# Patient Record
Sex: Male | Born: 1985 | Race: Black or African American | Hispanic: No | Marital: Single | Smoking: Never smoker
Health system: Southern US, Community
[De-identification: ages and names within clinical notes are randomized; demographics above are authoritative.]

## PROBLEM LIST (undated history)

## (undated) DIAGNOSIS — I429 Cardiomyopathy, unspecified: Secondary | ICD-10-CM

## (undated) DIAGNOSIS — T8859XA Other complications of anesthesia, initial encounter: Secondary | ICD-10-CM

## (undated) DIAGNOSIS — I517 Cardiomegaly: Secondary | ICD-10-CM

## (undated) DIAGNOSIS — I2699 Other pulmonary embolism without acute cor pulmonale: Secondary | ICD-10-CM

## (undated) DIAGNOSIS — K429 Umbilical hernia without obstruction or gangrene: Secondary | ICD-10-CM

## (undated) DIAGNOSIS — Z9989 Dependence on other enabling machines and devices: Secondary | ICD-10-CM

## (undated) DIAGNOSIS — G4733 Obstructive sleep apnea (adult) (pediatric): Secondary | ICD-10-CM

## (undated) DIAGNOSIS — T4145XA Adverse effect of unspecified anesthetic, initial encounter: Secondary | ICD-10-CM

## (undated) HISTORY — DX: Cardiomyopathy, unspecified: I42.9

## (undated) HISTORY — DX: Cardiomegaly: I51.7

## (undated) HISTORY — PX: WISDOM TOOTH EXTRACTION: SHX21

## (undated) HISTORY — DX: Obstructive sleep apnea (adult) (pediatric): Z99.89

## (undated) HISTORY — DX: Obstructive sleep apnea (adult) (pediatric): G47.33

---
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
## 2010-05-15 ENCOUNTER — Emergency Department (HOSPITAL_COMMUNITY): Admission: EM | Admit: 2010-05-15 | Discharge: 2010-05-16 | Payer: Self-pay | Admitting: Emergency Medicine

## 2010-05-17 ENCOUNTER — Emergency Department (HOSPITAL_COMMUNITY): Admission: EM | Admit: 2010-05-17 | Discharge: 2010-05-17 | Payer: Self-pay | Admitting: Emergency Medicine

## 2011-02-12 LAB — CBC
HCT: 40.7 % (ref 39.0–52.0)
Hemoglobin: 13.2 g/dL (ref 13.0–17.0)
MCHC: 32.1 g/dL (ref 30.0–36.0)
MCV: 82.7 fL (ref 78.0–100.0)
Platelets: 167 10*3/uL (ref 150–400)
RDW: 14 % (ref 11.5–15.5)
WBC: 15 10*3/uL — ABNORMAL HIGH (ref 4.0–10.5)

## 2011-02-12 LAB — DIFFERENTIAL
Basophils Absolute: 0 10*3/uL (ref 0.0–0.1)
Eosinophils Absolute: 0 10*3/uL (ref 0.0–0.7)
Eosinophils Relative: 0 % (ref 0–5)
Eosinophils Relative: 0 % (ref 0–5)
Lymphs Abs: 1.4 10*3/uL (ref 0.7–4.0)
Monocytes Absolute: 1.6 10*3/uL — ABNORMAL HIGH (ref 0.1–1.0)
Monocytes Relative: 11 % (ref 3–12)
Monocytes Relative: 12 % (ref 3–12)
Neutro Abs: 11.4 10*3/uL — ABNORMAL HIGH (ref 1.7–7.7)
Neutrophils Relative %: 76 % (ref 43–77)
Neutrophils Relative %: 80 % — ABNORMAL HIGH (ref 43–77)

## 2011-02-12 LAB — BASIC METABOLIC PANEL
BUN: 11 mg/dL (ref 6–23)
CO2: 25 mEq/L (ref 19–32)
Chloride: 104 mEq/L (ref 96–112)
Creatinine, Ser: 1.22 mg/dL (ref 0.4–1.5)
GFR calc non Af Amer: >60 mL/min (ref >60)
Glucose, Bld: 96 mg/dL (ref 70–99)
Potassium: 3.7 mEq/L (ref 3.5–5.1)
Potassium: 3.7 mEq/L (ref 3.5–5.1)

## 2011-02-12 LAB — RAPID STREP SCREEN (MED CTR MEBANE ONLY): Streptococcus, Group A Screen (Direct): NEGATIVE

## 2011-07-20 ENCOUNTER — Emergency Department (HOSPITAL_COMMUNITY): Payer: Self-pay

## 2011-07-20 ENCOUNTER — Emergency Department (HOSPITAL_COMMUNITY)
Admission: EM | Admit: 2011-07-20 | Discharge: 2011-07-20 | Disposition: A | Discharge status: Home / Self Care | Payer: Self-pay | Attending: Emergency Medicine | Admitting: Emergency Medicine

## 2011-07-20 DIAGNOSIS — R0602 Shortness of breath: Secondary | ICD-10-CM | POA: Insufficient documentation

## 2011-07-20 DIAGNOSIS — J40 Bronchitis, not specified as acute or chronic: Secondary | ICD-10-CM | POA: Insufficient documentation

## 2011-07-20 DIAGNOSIS — R05 Cough: Secondary | ICD-10-CM | POA: Insufficient documentation

## 2011-07-20 DIAGNOSIS — R059 Cough, unspecified: Secondary | ICD-10-CM | POA: Insufficient documentation

## 2012-08-18 ENCOUNTER — Emergency Department (HOSPITAL_BASED_OUTPATIENT_CLINIC_OR_DEPARTMENT_OTHER)
Admission: EM | Admit: 2012-08-18 | Discharge: 2012-08-19 | Disposition: A | Discharge status: Home / Self Care | Payer: BC Managed Care – PPO | Attending: Emergency Medicine | Admitting: Emergency Medicine

## 2012-08-18 ENCOUNTER — Encounter (HOSPITAL_BASED_OUTPATIENT_CLINIC_OR_DEPARTMENT_OTHER): Payer: Self-pay | Admitting: *Deleted

## 2012-08-18 DIAGNOSIS — S161XXA Strain of muscle, fascia and tendon at neck level, initial encounter: Secondary | ICD-10-CM

## 2012-08-18 DIAGNOSIS — S139XXA Sprain of joints and ligaments of unspecified parts of neck, initial encounter: Secondary | ICD-10-CM | POA: Insufficient documentation

## 2012-08-18 DIAGNOSIS — X58XXXA Exposure to other specified factors, initial encounter: Secondary | ICD-10-CM | POA: Insufficient documentation

## 2012-08-18 MED ORDER — CYCLOBENZAPRINE HCL 10 MG PO TABS
10.0000 mg | ORAL_TABLET | Freq: Once | ORAL | Status: AC
  Administered 2012-08-18: 10 mg via ORAL

## 2012-08-18 MED ORDER — ORPHENADRINE CITRATE ER 100 MG PO TB12: 100.0000 mg | ORAL_TABLET | Freq: Two times a day (BID) | ORAL | Status: DC

## 2012-08-18 MED ORDER — CYCLOBENZAPRINE HCL 10 MG PO TABS
5.0000 mg | ORAL_TABLET | Freq: Once | ORAL | Status: DC
  Filled 2012-08-18: qty 1

## 2012-08-18 MED ORDER — NAPROXEN 500 MG PO TABS: 500.0000 mg | ORAL_TABLET | Freq: Two times a day (BID) | ORAL | Status: DC

## 2012-08-18 MED ORDER — IBUPROFEN 800 MG PO TABS
800.0000 mg | ORAL_TABLET | Freq: Once | ORAL | Status: AC
  Administered 2012-08-18: 800 mg via ORAL
  Filled 2012-08-18: qty 1

## 2012-08-18 NOTE — ED Provider Notes (Signed)
History   This chart was scribed for Dione Booze, MD by Sofie Rower. The patient was seen in room MH11/MH11 and the patient's care was started at 11:01PM.     CSN: [id number]  Arrival date & time 08/18/12  2152   First MD Initiated Contact with Patient 08/18/12 2301      Chief Complaint  Patient presents with  . Neck Pain    (Consider location/radiation/quality/duration/timing/severity/associated sxs/prior treatment) Patient is a 26 y.o. male presenting with neck pain. The history is provided by the patient. No language interpreter was used.  Neck Pain  This is a new problem. The current episode started more than 1 week ago. The problem occurs constantly. The problem has been gradually worsening. The pain is associated with nothing. There has been no fever. The pain is present in the generalized neck. The quality of the pain is described as stabbing. The pain does not radiate. The pain is at a severity of 7/10. The pain is moderate. The symptoms are aggravated by position and twisting. The pain is the same all the time. Associated symptoms include headaches. Pertinent negatives include no numbness, no tingling and no weakness. He has tried NSAIDs for the symptoms. The treatment provided moderate relief.    Joe Deleon is a 26 y.o. male who presents to the Emergency Department complaining of  gradual, progressively worsening, neck pain located at the neck and interscapular area onset one week ago with associated symptoms of back pain and headache. The pt reports the neck pain he is experiencing is a stabbing sensation. In addition, the pt rates his neck pain at 7/10 at present, and 8/10 at worst. Modifying factors include certain movements and positions of the head and neck which intensify the neck pain and taking motrin (200 mg) which provides moderate relief of neck pain. The pt denies weakness, numbness, and tingling associated with the neck pain.    The pt does not smoke or drink  alcohol.   Pt does not have a PCP at present.    History reviewed. No pertinent past medical history.  History reviewed. No pertinent past surgical history.  No family history on file.  History  Substance Use Topics  . Smoking status: Never Smoker   . Smokeless tobacco: Not on file  . Alcohol Use: No      Review of Systems  HENT: Positive for neck pain.   Neurological: Positive for headaches. Negative for tingling, weakness and numbness.  All other systems reviewed and are negative.    Allergies  Review of patient's allergies indicates no known allergies.  Home Medications  No current outpatient prescriptions on file.  BP 132/70  Pulse 85  Temp 97.7 F (36.5 C)  Resp 18  Ht 6\' 2"  (1.88 m)  Wt 370 lb (167.831 kg)  BMI 47.51 kg/m2  SpO2 99%  Physical Exam  Nursing note and vitals reviewed. Constitutional: He is oriented to person, place, and time. He appears well-developed and well-nourished.       Obese.   HENT:  Head: Atraumatic.  Right Ear: External ear normal.  Left Ear: External ear normal.  Nose: Nose normal.  Eyes: Conjunctivae normal and EOM are normal.  Neck: Normal range of motion. Neck supple.  Cardiovascular: Normal rate, regular rhythm and normal heart sounds.   Pulmonary/Chest: Effort normal and breath sounds normal.  Abdominal: Soft. Bowel sounds are normal.  Musculoskeletal: Normal range of motion.       Moderate paracervical and paraspinal muscle spasm detected.  Neurological: He is alert and oriented to person, place, and time.  Skin: Skin is warm and dry.  Psychiatric: He has a normal mood and affect. His behavior is normal.    ED Course  Procedures (including critical care time)  DIAGNOSTIC STUDIES: Oxygen Saturation is 99% on room air, normal by my interpretation.    COORDINATION OF CARE:    11:06PM- Treatment plan concerning anti-inflammatories and muscle relaxer's, application of ice and heat discussed. Pt agrees with  treatment.    1. Cervical strain       MDM  Neck pain which seems to be clearly musculoskeletal. Curiously, it is better with when he is moving. However, he does have muscle spasm on exam. You'll be treated symptomatically with a prescription given for naproxen and Norflex. He is concerned about possibly having injured it at work, so he is given a work release to not lift anything greater than 25 pounds for the next 5 days. He is referred to Dr. Pearletha Forge of sports medicine for further evaluation.      I personally performed the services described in this documentation, which was scribed in my presence. The recorded information has been reviewed and considered.      Dione Booze, MD 08/19/12 [phone]

## 2012-08-18 NOTE — ED Notes (Signed)
Pt presents to ED today with neck stiffness that radiates into upper back.  Pt reports no injury or trauma and no obvious deformities noted.

## 2012-08-18 NOTE — ED Notes (Signed)
MD at bedside. 

## 2012-08-18 NOTE — ED Notes (Signed)
Pt presents to ED today with neck and back pain with no reported injury.  Pt states pain has been ongoing for 1 week and has tried taking Motrin with no relief.

## 2012-08-19 NOTE — ED Notes (Signed)
Extensive discharge education and planning discussed with pt and family.  Pt and family extremely upset with EDP as they requested to speak with him and he never went back into room.  Discussed with pt, need to rest muscles to allow for healing.  Pt was only given "restricted work note".  Education done on alternatives for work and options for discussing medical information with co workers and boss.

## 2012-08-21 ENCOUNTER — Emergency Department (INDEPENDENT_AMBULATORY_CARE_PROVIDER_SITE_OTHER): Payer: BC Managed Care – PPO

## 2012-08-21 ENCOUNTER — Encounter (HOSPITAL_COMMUNITY): Payer: Self-pay

## 2012-08-21 ENCOUNTER — Emergency Department (INDEPENDENT_AMBULATORY_CARE_PROVIDER_SITE_OTHER)
Admission: EM | Admit: 2012-08-21 | Discharge: 2012-08-21 | Disposition: A | Discharge status: Home / Self Care | Payer: BC Managed Care – PPO | Source: Home / Self Care | Attending: Emergency Medicine | Admitting: Emergency Medicine

## 2012-08-21 DIAGNOSIS — M542 Cervicalgia: Secondary | ICD-10-CM

## 2012-08-21 MED ORDER — HYDROCODONE-IBUPROFEN 7.5-200 MG PO TABS: 1.0000 | ORAL_TABLET | Freq: Three times a day (TID) | ORAL | Status: AC | PRN

## 2012-08-21 NOTE — ED Notes (Addendum)
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 States he reported this to his immediate supervisor on the day of the occurrence. Denies numbness , tingling, pain in his arms

## 2012-08-21 NOTE — ED Provider Notes (Signed)
History     CSN: [id number]  Arrival date & time 08/21/12  1826   First MD Initiated Contact with Patient 08/21/12 1827      Chief Complaint  Patient presents with  . Neck Pain    (Consider location/radiation/quality/duration/timing/severity/associated sxs/prior treatment) HPI Comments: Patient presents urgent care accompanied by his mother as he describes he continues to experience pain on his neck area towards the left upper portion of his shoulder. The pain continues to be constant. Patient was seen in the emergency department 2 days ago was diagnosed with muscular spasms. Patient denies any numbness, tingling sensations of any of his upper or lower extremities. 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 Patient denies any other symptoms such as fevers, generalized malaise, myalgias and arthralgias, headaches nausea vomiting. He's been taking some Motrin with some relief but he feels the pain is somewhat worse than when he was seen a couple days ago. There are certain movements especially turning his head towards his left side or bending his head forward this seemed to exacerbate the pain further.  The history is provided by the patient.    History reviewed. No pertinent past medical history.  History reviewed. No pertinent past surgical history.  History reviewed. No pertinent family history.  History  Substance Use Topics  . Smoking status: Never Smoker   . Smokeless tobacco: Not on file  . Alcohol Use: No      Review of Systems  Constitutional: Positive for activity change. Negative for fever, chills, diaphoresis and fatigue.  HENT: Positive for neck pain. Negative for neck stiffness.   Eyes: Negative for visual disturbance.  Musculoskeletal: Positive for back pain. Negative for myalgias, joint swelling and  arthralgias.  Neurological: Negative for dizziness, weakness, numbness and headaches.  Psychiatric/Behavioral: Negative for behavioral problems and confusion.    Allergies  Review of patient's allergies indicates no known allergies.  Home Medications   Current Outpatient Rx  Name Route Sig Dispense Refill  . HYDROCODONE-IBUPROFEN 7.5-200 MG PO TABS Oral Take 1 tablet by mouth every 8 (eight) hours as needed for pain. 15 tablet 0  . ORPHENADRINE CITRATE ER 100 MG PO TB12 Oral Take 1 tablet (100 mg total) by mouth 2 (two) times daily. 30 tablet 0    BP 118/85  Pulse 90  Temp 98.4 F (36.9 C) (Oral)  Resp 16  SpO2 96%  Physical Exam  Nursing note and vitals reviewed. Constitutional: He appears well-developed and well-nourished.  Neck: Trachea normal and normal range of motion. Spinous process tenderness and muscular tenderness present. No rigidity. No tracheal deviation, no edema, no erythema and normal range of motion present. No Brudzinski's sign and no Kernig's sign noted. No mass present.  Musculoskeletal: He exhibits tenderness.       Arms: Neurological: He is alert. No cranial nerve deficit. He exhibits normal muscle tone. Coordination normal.  Skin: Skin is warm. No ecchymosis noted. No erythema.    ED Course  Procedures (including critical care time)  Labs Reviewed - No data to display Dg Cervical Spine Complete  08/21/2012  *RADIOLOGY REPORT*  Clinical Data: Neck pain, lifting injury  CERVICAL SPINE - COMPLETE 4+ VIEW  Comparison: None.  Findings: Cervical spine visualize to C6-7 on the lateral view and the 71 on the lateral swimmer's view.  No  evidence of fracture or dislocation.  Vertebral body heights and intervertebral disc spaces are maintained.  The dens appears intact.  Lateral masses of C1 are symmetric.  No prevertebral soft tissue swelling.  The bilateral neural foramina are patent.  Visualized lung apices are clear.  IMPRESSION: Normal cervical spine  radiographs.   Original Report Authenticated By: Charline Bills, M.D.      1. Neck pain, musculoskeletal       MDM   Patient presents to urgent care with ongoing posterior cervical pain with some radiation to his left upper trapezium region. Exam was somewhat consistent with musculoskeletal strain/sprain. X-rays were negative for subluxations, fractures or other abnormalities. Patient has been prescribed a Vicoprofen course of 5 days every 8 hours. A work note was admitted to stay off his work for the next 48 hours to return with some restrictions for 3 days and otherwise to followup with occupational health workers comp if pain was to persist. We discuss symptoms of should warrant further evaluation in the emergency department both patient and mother understands, agrees with treatment plan followup care as necessary.       Jimmie Molly, MD 08/21/12 2105

## 2012-08-22 NOTE — ED Notes (Signed)
Pt called stating that he was seen here last night and given a work note to be out today.  States that he was told that if didn't think he could go back to work that he could call back and get another note.  I told the pt that we don't typically do that but that I would pull his chart and speak with the MD that seen him.  Pt starts telling someone in the background that " she says they dont' typically do that, well oh yes she is going to do that".  I said sir, I hear everything your saying and I said typically we don't but sometimes they do.  I pulled his chart, spoke with Dr. Ladon Applebaum and he agreed to give pt ONLY one more day off and that he had to follow up with [facility] or Ortho.  I told pt this and he said he has an appt Monday, I told him that Dr. Ladon Applebaum remembered his case and agreed to one more day but that we would NOT give him any more days off for this injury.  Pt agreed.  Note placed at front desk.

## 2012-09-12 ENCOUNTER — Other Ambulatory Visit: Payer: Self-pay | Admitting: Orthopaedic Surgery

## 2012-09-12 DIAGNOSIS — M542 Cervicalgia: Secondary | ICD-10-CM

## 2012-09-15 ENCOUNTER — Ambulatory Visit
Admission: RE | Admit: 2012-09-15 | Discharge: 2012-09-15 | Disposition: A | Discharge status: Home / Self Care | Payer: BC Managed Care – PPO | Source: Ambulatory Visit | Attending: Orthopaedic Surgery | Admitting: Orthopaedic Surgery

## 2012-09-15 DIAGNOSIS — M542 Cervicalgia: Secondary | ICD-10-CM

## 2013-12-07 ENCOUNTER — Emergency Department (HOSPITAL_BASED_OUTPATIENT_CLINIC_OR_DEPARTMENT_OTHER)
Admission: EM | Admit: 2013-12-07 | Discharge: 2013-12-07 | Disposition: A | Discharge status: Home / Self Care | Payer: BC Managed Care – PPO | Attending: Emergency Medicine | Admitting: Emergency Medicine

## 2013-12-07 ENCOUNTER — Emergency Department (HOSPITAL_BASED_OUTPATIENT_CLINIC_OR_DEPARTMENT_OTHER): Payer: BC Managed Care – PPO

## 2013-12-07 ENCOUNTER — Encounter (HOSPITAL_BASED_OUTPATIENT_CLINIC_OR_DEPARTMENT_OTHER): Payer: Self-pay | Admitting: Emergency Medicine

## 2013-12-07 DIAGNOSIS — IMO0001 Reserved for inherently not codable concepts without codable children: Secondary | ICD-10-CM | POA: Insufficient documentation

## 2013-12-07 DIAGNOSIS — R059 Cough, unspecified: Secondary | ICD-10-CM | POA: Insufficient documentation

## 2013-12-07 DIAGNOSIS — J3489 Other specified disorders of nose and nasal sinuses: Secondary | ICD-10-CM | POA: Insufficient documentation

## 2013-12-07 DIAGNOSIS — R630 Anorexia: Secondary | ICD-10-CM | POA: Insufficient documentation

## 2013-12-07 DIAGNOSIS — R05 Cough: Secondary | ICD-10-CM | POA: Insufficient documentation

## 2013-12-07 DIAGNOSIS — R5381 Other malaise: Secondary | ICD-10-CM | POA: Insufficient documentation

## 2013-12-07 DIAGNOSIS — R6889 Other general symptoms and signs: Secondary | ICD-10-CM

## 2013-12-07 DIAGNOSIS — R5383 Other fatigue: Secondary | ICD-10-CM | POA: Insufficient documentation

## 2013-12-07 DIAGNOSIS — R110 Nausea: Secondary | ICD-10-CM | POA: Insufficient documentation

## 2013-12-07 MED ORDER — PHENYLEPH-PROMETHAZINE-COD 5-6.25-10 MG/5ML PO SYRP: 5.0000 mL | ORAL_SOLUTION | ORAL | Status: DC

## 2013-12-07 NOTE — ED Provider Notes (Signed)
CSN: [id number]     Arrival date & time 12/07/13  1645 History  This chart was scribed for non-physician practitioner Kerrie BuffaloHope Abie Cheek, NP working with Hurman HornJohn M Bednar, MD by Dorothey Basemania Sutton, ED Scribe. This patient was seen in room MH03/MH03 and the patient's care was started at 6:46 PM.    Chief Complaint  Patient presents with  . Chills   The history is provided by the patient. No language interpreter was used.   HPI Comments: Joe Deleon is a 28 y.o. male who presents to the Emergency Department complaining of an intermittent cough with 1 episode of brown sputum this morning onset 3 days ago that has been progressively worsening. Patient reports associated subjective fever (patient is afebrile at 99.6 in the ED), chills, fatigue, congestion, rhinorrhea, diffuse headache, and diffuse myalgias. He reports some associated mild nausea that is alleviated with sipping soda. He states that he has been staying well-hydrated. He denies any sick contacts, but states that he is exposed to children at his work. He denies sore throat, ear pain, shortness of breath, urinary frequency, dysuria, back pain, leg swelling, abdominal pain, emesis. Patient reports a history of pneumonia in 2012. Patient has no other pertinent medical history. Patient is a non-smoker.  History reviewed. No pertinent past medical history. Past Surgical History  Procedure Laterality Date  . Wisdom tooth extraction     No family history on file. History  Substance Use Topics  . Smoking status: Never Smoker   . Smokeless tobacco: Never Used  . Alcohol Use: No    Review of Systems  Constitutional: Positive for fever (subjective), chills and fatigue.  HENT: Positive for congestion and rhinorrhea. Negative for ear pain and sore throat.   Respiratory: Positive for cough. Negative for shortness of breath.   Gastrointestinal: Positive for nausea. Negative for vomiting and abdominal pain.  Genitourinary: Negative for dysuria and frequency.   Musculoskeletal: Positive for myalgias. Negative for back pain.  Neurological: Positive for headaches.    Allergies  Review of patient's allergies indicates no known allergies.  Home Medications   Current Outpatient Rx  Name  Route  Sig  Dispense  Refill  . orphenadrine (NORFLEX) 100 MG tablet   Oral   Take 1 tablet (100 mg total) by mouth 2 (two) times daily.   30 tablet   0    Triage Vitals: BP 131/85  Pulse 86  Temp(Src) 99.6 F (37.6 C) (Oral)  Resp 20  Ht 6\' 2"  (1.88 m)  Wt 363 lb (164.656 kg)  BMI 46.59 kg/m2  SpO2 95%  Physical Exam  Nursing note and vitals reviewed. Constitutional: He is oriented to person, place, and time. He appears well-developed and well-nourished. No distress.  HENT:  Head: Normocephalic and atraumatic.  Right Ear: Hearing, tympanic membrane, external ear and ear canal normal.  Left Ear: Hearing, tympanic membrane, external ear and ear canal normal.  Mouth/Throat: No oropharyngeal exudate.  Pharynx is mildly erythematous. Uvula is midline.   Eyes: Conjunctivae are normal.  Neck: Normal range of motion. Neck supple.  Cardiovascular: Normal rate, regular rhythm and normal heart sounds.   Pulmonary/Chest: Effort normal and breath sounds normal. No respiratory distress. He has no wheezes. He has no rales.  Abdominal: Soft. Bowel sounds are normal. He exhibits no distension. There is no tenderness. There is no rebound and no guarding.  No CVA tenderness.   Musculoskeletal: Normal range of motion.  Lymphadenopathy:    He has no cervical adenopathy.  Neurological: He is  alert and oriented to person, place, and time.  Skin: Skin is warm and dry.  Psychiatric: He has a normal mood and affect. His behavior is normal.    ED Course  Procedures (including critical care time)  DIAGNOSTIC STUDIES: Oxygen Saturation is 95% on room air, adequate by my interpretation.    COORDINATION OF CARE: 6:52 PM- Will order a chest x-ray to rule out  pneumonia. Will discharge patient with Phenyleph-Promethazine to manage symptoms. Discussed treatment plan with patient at bedside and patient verbalized agreement.    Imaging Review Dg Chest 2 View  12/07/2013   CLINICAL DATA:  Three day history of cough, fever, chills, fatigue, chest congestion, rhinorrhea, headache, and myalgias.  EXAM: CHEST  2 VIEW  COMPARISON:  07/20/2011, 05/16/2010.  FINDINGS: Cardiac silhouette mildly to moderately enlarged but stable. Hilar and mediastinal contours otherwise unremarkable. Lungs clear. Bronchovascular markings normal. Pulmonary vascularity normal. No visible pleural effusions. No pneumothorax. No significant interval change.  IMPRESSION: Stable cardiomegaly.  No acute cardiopulmonary disease.   Electronically Signed   By: Hulan Saas M.D.   On: 12/07/2013 19:12     MDM  SUBJECTIVE:  Joe Deleon is a 28 y.o. male who present complaining of flu-like symptoms: fevers, chills, myalgias, congestion, sore throat and cough for 2 days. Denies dyspnea or wheezing.  OBJECTIVE: Appears moderately ill but not toxic; temperature as noted in vitals. Ears normal. Throat and pharynx normal.  Neck supple. No adenopathy in the neck. Sinuses non tender. The chest is clear.  ASSESSMENT: Influenza  PLAN: Symptomatic therapy suggested: rest, increase fluids, gargle prn for sore throat, use mist of vaporizer prn and call prn if symptoms persist or worsen. Call or return to clinic prn if these symptoms worsen or fail to improve as anticipated. Patient agrees with plan of care.   Medication List    TAKE these medications       Phenyleph-Promethazine-Cod 5-6.25-10 MG/5ML Syrp  Take 5 mLs by mouth every 4 (four) hours.      ASK your doctor about these medications       orphenadrine 100 MG tablet  Commonly known as:  NORFLEX  Take 1 tablet (100 mg total) by mouth 2 (two) times daily.         I personally performed the services described in this  documentation, which was scribed in my presence. The recorded information has been reviewed and is accurate.     [facility] Orlene Och, [place] 12/08/13 1747

## 2013-12-07 NOTE — ED Notes (Signed)
Cough, cold chills, weak, nausea, body aches, since Friday- states getting progressively worse- taking tylenol cough and other otc meds

## 2013-12-08 NOTE — ED Provider Notes (Signed)
Medical screening examination/treatment/procedure(s) were performed by non-physician practitioner and as supervising physician I was immediately available for consultation/collaboration.  EKG Interpretation   None        Imaad Reuss M Willard Farquharson, MD 12/08/13 2121 

## 2014-05-19 ENCOUNTER — Encounter (HOSPITAL_COMMUNITY): Payer: Self-pay | Admitting: Emergency Medicine

## 2014-05-19 ENCOUNTER — Emergency Department (INDEPENDENT_AMBULATORY_CARE_PROVIDER_SITE_OTHER)
Admission: EM | Admit: 2014-05-19 | Discharge: 2014-05-19 | Disposition: A | Discharge status: Home / Self Care | Payer: BC Managed Care – PPO | Source: Home / Self Care | Attending: Family Medicine | Admitting: Family Medicine

## 2014-05-19 DIAGNOSIS — K429 Umbilical hernia without obstruction or gangrene: Secondary | ICD-10-CM

## 2014-05-19 NOTE — ED Notes (Signed)
C/o mass on abd area for a week now Mass is painful when touched No discharge

## 2014-05-19 NOTE — Discharge Instructions (Signed)
Call the surgeon if this start to bother you more. It is not dangerous to you.   Take Care,   Dr. Clinton SawyerWilliamson   Hernia A hernia occurs when an internal organ pushes out through a weak spot in the abdominal wall. Hernias most commonly occur in the groin and around the navel. Hernias often can be pushed back into place (reduced). Most hernias tend to get worse over time. Some abdominal hernias can get stuck in the opening (irreducible or incarcerated hernia) and cannot be reduced. An irreducible abdominal hernia which is tightly squeezed into the opening is at risk for impaired blood supply (strangulated hernia). A strangulated hernia is a medical emergency. Because of the risk for an irreducible or strangulated hernia, surgery may be recommended to repair a hernia. CAUSES   Heavy lifting.  Prolonged coughing.  Straining to have a bowel movement.  A cut (incision) made during an abdominal surgery. HOME CARE INSTRUCTIONS   Bed rest is not required. You may continue your normal activities.  Avoid lifting more than 10 pounds (4.5 kg) or straining.  Cough gently. If you are a smoker it is best to stop. Even the best hernia repair can break down with the continual strain of coughing. Even if you do not have your hernia repaired, a cough will continue to aggravate the problem.  Do not wear anything tight over your hernia. Do not try to keep it in with an outside bandage or truss. These can damage abdominal contents if they are trapped within the hernia sac.  Eat a normal diet.  Avoid constipation. Straining over long periods of time will increase hernia size and encourage breakdown of repairs. If you cannot do this with diet alone, stool softeners may be used. SEEK IMMEDIATE MEDICAL CARE IF:   You have a fever.  You develop increasing abdominal pain.  You feel nauseous or vomit.  Your hernia is stuck outside the abdomen, looks discolored, feels hard, or is tender.  You have any  changes in your bowel habits or in the hernia that are unusual for you.  You have increased pain or swelling around the hernia.  You cannot push the hernia back in place by applying gentle pressure while lying down. MAKE SURE YOU:   Understand these instructions.  Will watch your condition.  Will get help right away if you are not doing well or get worse. Document Released: 11/13/2005 Document Revised: 02/05/2012 Document Reviewed: 07/02/2008 [facility] Patient Information 2015 [place] [place]. This information is not intended to replace advice given to you by your health care provider. Make sure you discuss any questions you have with your health care provider.

## 2014-05-19 NOTE — ED Provider Notes (Signed)
CSN: [id number]     Arrival date & time 05/19/14  1707 History   First MD Initiated Contact with Patient 05/19/14 1755     Chief Complaint  Patient presents with  . Mass   (Consider location/radiation/quality/duration/timing/severity/associated sxs/prior Treatment) HPI  Abdominal mass - located in his navel, duration of 2 week, not painful, firm, small, not impacted by eating, not associated with nausea, vomiting, or weight loss  Fam hx - multiple family members with umbilical hernia   History reviewed. No pertinent past medical history. Past Surgical History  Procedure Laterality Date  . Wisdom tooth extraction     History reviewed. No pertinent family history. History  Substance Use Topics  . Smoking status: Never Smoker   . Smokeless tobacco: Never Used  . Alcohol Use: No    Review of Systems See HPI Allergies  Review of patient's allergies indicates no known allergies.  Home Medications   Prior to Admission medications   Medication Sig Start Date End Date Taking? Authorizing Madason Rauls  orphenadrine (NORFLEX) 100 MG tablet Take 1 tablet (100 mg total) by mouth 2 (two) times daily. 08/18/12   Dione Boozeavid Glick, MD  Phenyleph-Promethazine-Cod 5-6.25-10 MG/5ML SYRP Take 5 mLs by mouth every 4 (four) hours. 12/07/13   Hope Orlene OchM Neese, NP   BP 145/90  Pulse 88  Temp(Src) 97.4 F (36.3 C) (Oral)  Resp 20  SpO2 96% Physical Exam Gen: morbidly obese young AAM, non ill appearing, pleasant Abd: obese, small reducible umbilical hernia, non tender   ED Course  Procedures (including critical care time) Labs Review Labs Reviewed - No data to display  Imaging Review No results found.   MDM   1. Umbilical hernia without obstruction and without gangrene    Small, uncomplicated, no need for further workup. Patient given contact information for general surgery for consultation if desired.    Garnetta BuddyEdward Williamson V, MD 05/19/14 [phone]

## 2014-05-22 NOTE — ED Provider Notes (Signed)
Medical screening examination/treatment/procedure(s) were performed by resident physician or non-physician practitioner and as supervising physician I was immediately available for consultation/collaboration.   KINDL,JAMES DOUGLAS MD.   James D Kindl, MD 05/22/14 1001 

## 2014-06-12 ENCOUNTER — Ambulatory Visit (INDEPENDENT_AMBULATORY_CARE_PROVIDER_SITE_OTHER): Payer: BC Managed Care – PPO | Admitting: General Surgery

## 2014-06-12 ENCOUNTER — Other Ambulatory Visit (INDEPENDENT_AMBULATORY_CARE_PROVIDER_SITE_OTHER): Payer: Self-pay

## 2014-06-12 ENCOUNTER — Encounter (INDEPENDENT_AMBULATORY_CARE_PROVIDER_SITE_OTHER): Payer: Self-pay | Admitting: General Surgery

## 2014-06-12 VITALS — BP 136/74 | HR 74 | Temp 96.9°F | Resp 16 | Ht 73.0 in | Wt 380.0 lb

## 2014-06-12 DIAGNOSIS — K429 Umbilical hernia without obstruction or gangrene: Secondary | ICD-10-CM

## 2014-06-12 NOTE — Progress Notes (Signed)
Patient ID: Joe Deleon, male   DOB: 05/19/1986, 28 y.o.   MRN: [id number]  Chief Complaint  Patient presents with  . Umbilical Hernia    new pt    HPI Joe BargesBrandon Deleon is a 28 y.o. male.  The patient is a 28 year old male who is referred for urgent care secondary to umbilical hernia. Patient states it's been there for approximately 1-2 years. He states he noticed a hernia and decided to have this checked out. He states he's had no pain in the area. He recently underwent a CT scan at outside ER secondary to pain at that area. This resulted in an umbilical hernia which ago. He's had no signs or symptoms of incarceration or strangulation. HPI  Past Medical History  Diagnosis Date  . Cardiomyopathy     released by cardiologist in 2007. Dr. Elizebeth Brookingotton    Past Surgical History  Procedure Laterality Date  . Wisdom tooth extraction      Family History  Problem Relation Age of Onset  . Cancer Maternal Grandmother     bladder    Social History History  Substance Use Topics  . Smoking status: Never Smoker   . Smokeless tobacco: Never Used  . Alcohol Use: No    No Known Allergies  Current Outpatient Prescriptions  Medication Sig Dispense Refill  . orphenadrine (NORFLEX) 100 MG tablet Take 1 tablet (100 mg total) by mouth 2 (two) times daily.  30 tablet  0  . oxyCODONE-acetaminophen (PERCOCET/ROXICET) 5-325 MG per tablet Take 1 tablet by mouth as needed.       No current facility-administered medications for this visit.    Review of Systems Review of Systems  Constitutional: Negative.   HENT: Negative.   Eyes: Negative.   Respiratory: Negative.   Cardiovascular: Negative.   Gastrointestinal: Negative.   Endocrine: Negative.   Neurological: Negative.     Blood pressure 136/74, pulse 74, temperature 96.9 F (36.1 C), temperature source Oral, resp. rate 16, height 6\' 1"  (1.854 m), weight 380 lb (172.367 kg).  Physical Exam Physical Exam  Constitutional: He is oriented  to person, place, and time. He appears well-developed and well-nourished.  HENT:  Head: Normocephalic and atraumatic.  Eyes: Conjunctivae and EOM are normal. Pupils are equal, round, and reactive to light.  Neck: Normal range of motion. Neck supple.  Cardiovascular: Normal rate, regular rhythm and normal heart sounds.   Pulmonary/Chest: Effort normal and breath sounds normal.  Abdominal: A hernia is present. Hernia confirmed positive in the ventral area.    Musculoskeletal: Normal range of motion.  Neurological: He is alert and oriented to person, place, and time.  Skin: Skin is warm and dry.    Data Reviewed As above  Assessment    28 year old male with a reducible umbilical hernia, morbid obesity     Plan    1. Discussed the patient signed his symptoms of incarceration or strangulation and the need to proceed to ER should these occur. 2. Had a long discussion the patient in regard to the patient's weight the possibility of increased recurrence after repair. I believe at this time secondary the patient's lack of symptoms we can observe the hernia. I discussed with him should this become incarcerated strangulated and/or give him pain that recurs we can discuss repair.       Marigene Ehlersamirez Jr., Djon Tith 06/12/2014, 10:41 AM

## 2014-08-25 ENCOUNTER — Encounter (HOSPITAL_COMMUNITY): Payer: Self-pay | Admitting: Emergency Medicine

## 2014-08-25 ENCOUNTER — Emergency Department (HOSPITAL_COMMUNITY)
Admission: EM | Admit: 2014-08-25 | Discharge: 2014-08-25 | Disposition: A | Discharge status: Home / Self Care | Payer: BC Managed Care – PPO | Attending: Emergency Medicine | Admitting: Emergency Medicine

## 2014-08-25 DIAGNOSIS — Z8679 Personal history of other diseases of the circulatory system: Secondary | ICD-10-CM | POA: Diagnosis not present

## 2014-08-25 DIAGNOSIS — K429 Umbilical hernia without obstruction or gangrene: Secondary | ICD-10-CM | POA: Diagnosis not present

## 2014-08-25 DIAGNOSIS — K469 Unspecified abdominal hernia without obstruction or gangrene: Secondary | ICD-10-CM | POA: Diagnosis present

## 2014-08-25 HISTORY — DX: Umbilical hernia without obstruction or gangrene: K42.9

## 2014-08-25 NOTE — Discharge Instructions (Signed)

## 2014-08-25 NOTE — ED Notes (Signed)
Pt stated hernia had been noted since July. Firm hernia noted around umbilical hernia. Pt reports having no complaints using the restroom. Pt is calm at this time with family present.

## 2014-08-25 NOTE — ED Notes (Signed)
Per pt, has hx of umbilical hernia. Has been reduced in past.  Started having hernia pain and bulging this morning.  Unable to reduce at home.

## 2014-08-25 NOTE — ED Notes (Signed)
Patient with no complaints at this time. Respirations even and unlabored. Skin warm/dry. Discharge instructions reviewed with patient at this time. Patient given opportunity to voice concerns/ask questions. Patient discharged at this time and left Emergency Department with steady gait.   

## 2014-08-25 NOTE — ED Provider Notes (Signed)
CSN: [id number]     Arrival date & time 08/25/14  1219 History   First MD Initiated Contact with Patient 08/25/14 1234     Chief Complaint  Patient presents with  . Hernia     (Consider location/radiation/quality/duration/timing/severity/associated sxs/prior Treatment) HPI Patient presents with concern of abdominal pain, or reducible hernia. Patient is a previously diagnosed umbilical hernia.  Typically, the patient is pain episodes, the result when the hernia is reduced, by him. Today, patient has had persistent pain, persistent bulging about the umbilicus and with no new vomiting, diarrhea, fever, chills, chest pain, dyspnea or other complaints. Patient has been unable to reduce the hernia in spite of typical maneuvers.  Past Medical History  Diagnosis Date  . Cardiomyopathy     released by cardiologist in 2007. Dr. Elizebeth Brooking  . Umbilical hernia    Past Surgical History  Procedure Laterality Date  . Wisdom tooth extraction     Family History  Problem Relation Age of Onset  . Cancer Maternal Grandmother     bladder   History  Substance Use Topics  . Smoking status: Never Smoker   . Smokeless tobacco: Never Used  . Alcohol Use: No    Review of Systems  Constitutional:       Per HPI, otherwise negative  HENT:       Per HPI, otherwise negative  Respiratory:       Per HPI, otherwise negative  Cardiovascular:       Per HPI, otherwise negative  Gastrointestinal: Negative for vomiting.  Endocrine:       Negative aside from HPI  Genitourinary:       Neg aside from HPI   Musculoskeletal:       Per HPI, otherwise negative  Skin: Negative.   Neurological: Negative for syncope.      Allergies  Review of patient's allergies indicates no known allergies.  Home Medications   Prior to Admission medications   Medication Sig Start Date End Date Taking? Authorizing Provider  acetaminophen (TYLENOL) 500 MG tablet Take 1,000 mg by mouth every 6 (six) hours as needed  (headache).   Yes Historical Provider, MD   BP 134/81  Pulse 85  Temp(Src) 98.1 F (36.7 C) (Oral)  Resp 18  SpO2 97% Physical Exam  Nursing note and vitals reviewed. Constitutional: He is oriented to person, place, and time. He appears well-developed. No distress.  HENT:  Head: Normocephalic and atraumatic.  Eyes: Conjunctivae and EOM are normal.  Cardiovascular: Normal rate and regular rhythm.   Pulmonary/Chest: Effort normal. No stridor. No respiratory distress.  Abdominal: He exhibits no distension.    Musculoskeletal: He exhibits no edema.  Neurological: He is alert and oriented to person, place, and time.  Skin: Skin is warm and dry.  Psychiatric: He has a normal mood and affect.    ED Course  Hernia reduction Date/Time: 08/25/2014 1:00 PM Performed by: Gerhard Munch Authorized by: Gerhard Munch Consent: Verbal consent obtained. The procedure was performed in an emergent situation. Risks and benefits: risks, benefits and alternatives were discussed Consent given by: patient Patient understanding: patient states understanding of the procedure being performed Patient consent: the patient's understanding of the procedure matches consent given Procedure consent: procedure consent matches procedure scheduled Relevant documents: relevant documents present and verified Test results: test results available and properly labeled Site marked: the operative site was marked Imaging studies: imaging studies available Required items: required blood products, implants, devices, and special equipment available Patient identity confirmed: verbally with  patient Time out: Immediately prior to procedure a "time out" was called to verify the correct patient, procedure, equipment, support staff and site/side marked as required. Preparation: Patient was prepped and draped in the usual sterile fashion. Local anesthesia used: no Patient sedated: no Patient tolerance: Patient tolerated  the procedure well with no immediate complications. Comments: Hernia successfully reduced without complication   (including critical care time) On repeat exam the patient is in no distress.  He and his mother voiced understanding of the need to follow up with [facility]. MDM   Patient presents with pain associated with irritable hernia. Following placement of ice packs, relaxation, the patient's hernia was reduced by me without complication. Absent, chills, other evidence for ischemic or infectious processes, the patient was discharged in stable condition to followup with our surgical colleagues    Gerhard Munchobert Bryn Perkin, MD 08/25/14 1327

## 2014-11-27 HISTORY — PX: KNEE SURGERY: SHX244

## 2015-07-29 ENCOUNTER — Encounter (HOSPITAL_COMMUNITY): Payer: Self-pay | Admitting: *Deleted

## 2015-07-29 ENCOUNTER — Emergency Department (HOSPITAL_COMMUNITY)
Admission: EM | Admit: 2015-07-29 | Discharge: 2015-07-29 | Disposition: A | Discharge status: Home / Self Care | Payer: BC Managed Care – PPO | Attending: Physician Assistant | Admitting: Physician Assistant

## 2015-07-29 DIAGNOSIS — Z8719 Personal history of other diseases of the digestive system: Secondary | ICD-10-CM | POA: Diagnosis not present

## 2015-07-29 DIAGNOSIS — R21 Rash and other nonspecific skin eruption: Secondary | ICD-10-CM | POA: Diagnosis present

## 2015-07-29 DIAGNOSIS — Z8679 Personal history of other diseases of the circulatory system: Secondary | ICD-10-CM | POA: Insufficient documentation

## 2015-07-29 DIAGNOSIS — L740 Miliaria rubra: Secondary | ICD-10-CM | POA: Diagnosis not present

## 2015-07-29 NOTE — ED Provider Notes (Signed)
CSN: [id number]     Arrival date & time 07/29/15  1607 History  This chart was scribed for non-physician practitioner, Eyvonne Mechanic, PA-C working with Abelino Derrick, MD by Doreatha Martin, ED scribe. This patient was seen in room TR09C/TR09C and the patient's care was started at 4:39 PM    Chief Complaint  Patient presents with  . Rash   The history is provided by the patient. No language interpreter was used.    HPI Comments: Joe Deleon is a 29 y.o. male who presents to the Emergency Department complaining of moderate, gradually worsenening rash to the BUE, BLE and torso (worse on the left) onset 5 days ago. He notes that he is outdoors frequently at his work. Pt has used hydrocortisone cream and Benadryl with mild relief. Pt is not currently followed by a PCP, but is followed by an insurance provider. No Hx of similar symptoms. No sick contact. No recent changes in soaps, lotions, detergent, fragrances. He denies any pruritic qualities, rash to the palm of his hands, fever, chills nausea, vomiting, CP, abdominal pain.    Past Medical History  Diagnosis Date  . Cardiomyopathy     released by cardiologist in 2007. Dr. Elizebeth Brooking  . Umbilical hernia    Past Surgical History  Procedure Laterality Date  . Wisdom tooth extraction     Family History  Problem Relation Age of Onset  . Cancer Maternal Grandmother     bladder   Social History  Substance Use Topics  . Smoking status: Never Smoker   . Smokeless tobacco: Never Used  . Alcohol Use: No    Review of Systems  All other systems reviewed and are negative.  Allergies  Review of patient's allergies indicates no known allergies.  Home Medications   Prior to Admission medications   Medication Sig Start Date End Date Taking? Authorizing Provider  acetaminophen (TYLENOL) 500 MG tablet Take 1,000 mg by mouth every 6 (six) hours as needed (headache).   Yes Historical Provider, MD   BP 142/82 mmHg  Pulse 96  Temp(Src) 99.3  F (37.4 C) (Oral)  Resp 16  SpO2 94% Physical Exam  Constitutional: He is oriented to person, place, and time. He appears well-developed and well-nourished.  HENT:  Head: Normocephalic and atraumatic.  Mouth/Throat: Oropharynx is clear and moist and mucous membranes are normal.  No lesions in the mouth.   Eyes: Conjunctivae and EOM are normal. Pupils are equal, round, and reactive to light.  Neck: Normal range of motion. Neck supple.  Cardiovascular: Normal rate, regular rhythm and normal heart sounds.   Pulmonary/Chest: Effort normal and breath sounds normal. No respiratory distress.  Abdominal: He exhibits no distension.  Musculoskeletal: Normal range of motion.  Neurological: He is alert and oriented to person, place, and time.  Skin: Skin is warm and dry. Rash noted.  Multiple papules, predominantly in the axilla and groin. No sings of infection. No erythema, calor, TTP. No lesions to the palms, feet.   Psychiatric: He has a normal mood and affect. His behavior is normal.  Nursing note and vitals reviewed.  ED Course  Procedures (including critical care time) Labs Review Labs Reviewed - No data to display  Imaging Review No results found. I have personally reviewed and evaluated these images and lab results as part of my medical decision-making.   EKG Interpretation None      MDM   Final diagnoses:  Heat rash   Labs:   Imaging:  Consults:  Therapeutics:  Discharge Meds:   Assessment/Plan: Pt advised to keep the skin dry while out in the heat, use mild lotions and soaps, follow up with PCP if the rash worsens.     I personally performed the services described in this documentation, which was scribed in my presence. The recorded information has been reviewed and is accurate.  Eyvonne Mechanic, PA-C 07/29/15 1737  Courteney Randall An, MD 07/29/15 2116

## 2015-07-29 NOTE — ED Notes (Signed)
Pt state non-pruritic rash over entire body since Sat.

## 2015-07-29 NOTE — Discharge Instructions (Signed)
Heat Rash Heat rash (miliaria) is a skin irritation caused by heavy sweating during hot, humid weather. It results from blockage of the sweat glands on our body. It can occur at any age. It is most common in young children whose sweat ducts are still developing or are not fully developed. Tight clothing may make the condition worse. Heat rash can look like small blisters (vesicles) that break open easily with bathing or minimal pressure. These blisters are found most commonly on the face, upper trunk of children and the trunk of adults. It can also look like a red cluster of red bumps or pimples (pustules). These usually itch and can also sometimes burn. It is more likely to occur on the neck and upper chest, in the groin, under the breasts, and in elbow creases. HOME CARE INSTRUCTIONS   The best treatment for heat rash is to provide a cooler, less humid environment where sweating is much decreased.  Keep the affected area dry. Dusting powder (cornstarch powder, baby powder) may be used to increase comfort. Avoid using ointments or creams. They keep the skin warm and moist and may make the condition worse.  Treating heat rash is simple and usually does not require medical assistance. SEEK MEDICAL CARE IF:   There is any evidence of infection such as fever, redness, swelling.  There is discomfort such as pain.  The skin lesions do no resolve with cooler, dryer environment. MAKE SURE YOU:   Understand these instructions.  Will watch your condition.  Will get help right away if you are not doing well or get worse. Document Released: 11/01/2009 Document Revised: 02/05/2012 Document Reviewed: 11/01/2009 [facility] Patient Information 2015 [place], [place]. This information is not intended to replace advice given to you by your health care provider. Make sure you discuss any questions you have with your health care provider.  Please read attached information. Please base twice a day using Cetaphil  anti-bacterial soap. Please avoid heavy moisturizers, or other body products. Please monitor for signs of infection, follow-up with primary care provider for further evaluation and management.

## 2015-07-30 ENCOUNTER — Encounter (HOSPITAL_COMMUNITY): Payer: Self-pay | Admitting: Emergency Medicine

## 2015-07-30 ENCOUNTER — Emergency Department (INDEPENDENT_AMBULATORY_CARE_PROVIDER_SITE_OTHER)
Admission: EM | Admit: 2015-07-30 | Discharge: 2015-07-30 | Disposition: A | Discharge status: Home / Self Care | Payer: BC Managed Care – PPO | Source: Home / Self Care | Attending: Family Medicine | Admitting: Family Medicine

## 2015-07-30 DIAGNOSIS — L259 Unspecified contact dermatitis, unspecified cause: Secondary | ICD-10-CM | POA: Diagnosis not present

## 2015-07-30 DIAGNOSIS — R21 Rash and other nonspecific skin eruption: Secondary | ICD-10-CM

## 2015-07-30 MED ORDER — PREDNISONE 20 MG PO TABS: ORAL_TABLET | ORAL | Status: DC

## 2015-07-30 MED ORDER — TRIAMCINOLONE ACETONIDE 40 MG/ML IJ SUSP
40.0000 mg | Freq: Once | INTRAMUSCULAR | Status: AC
  Administered 2015-07-30: 40 mg via INTRAMUSCULAR

## 2015-07-30 MED ORDER — TRIAMCINOLONE ACETONIDE 40 MG/ML IJ SUSP
INTRAMUSCULAR | Status: AC
  Filled 2015-07-30: qty 1

## 2015-07-30 NOTE — ED Notes (Signed)
Noticed rash on 8/27.  Rash is all over body.  Initially seen on left wrist, now on torso and extremities.  Rash only itches slightly.

## 2015-07-30 NOTE — Discharge Instructions (Signed)
Contact Dermatitis Start the prednisone taper tomorrow Take allegra or Claritin daily Contact dermatitis is a reaction to certain substances that touch the skin. Contact dermatitis can be either irritant contact dermatitis or allergic contact dermatitis. Irritant contact dermatitis does not require previous exposure to the substance for a reaction to occur.Allergic contact dermatitis only occurs if you have been exposed to the substance before. Upon a repeat exposure, your body reacts to the substance.  CAUSES  Many substances can cause contact dermatitis. Irritant dermatitis is most commonly caused by repeated exposure to mildly irritating substances, such as:  Makeup.  Soaps.  Detergents.  Bleaches.  Acids.  Metal salts, such as nickel. Allergic contact dermatitis is most commonly caused by exposure to:  Poisonous plants.  Chemicals (deodorants, shampoos).  Jewelry.  Latex.  Neomycin in triple antibiotic cream.  Preservatives in products, including clothing. SYMPTOMS  The area of skin that is exposed may develop:  Dryness or flaking.  Redness.  Cracks.  Itching.  Pain or a burning sensation.  Blisters. With allergic contact dermatitis, there may also be swelling in areas such as the eyelids, mouth, or genitals.  DIAGNOSIS  Your caregiver can usually tell what the problem is by doing a physical exam. In cases where the cause is uncertain and an allergic contact dermatitis is suspected, a patch skin test may be performed to help determine the cause of your dermatitis. TREATMENT Treatment includes protecting the skin from further contact with the irritating substance by avoiding that substance if possible. Barrier creams, powders, and gloves may be helpful. Your caregiver may also recommend:  Steroid creams or ointments applied 2 times daily. For best results, soak the rash area in cool water for 20 minutes. Then apply the medicine. Cover the area with a plastic  wrap. You can store the steroid cream in the refrigerator for a "chilly" effect on your rash. That may decrease itching. Oral steroid medicines may be needed in more severe cases.  Antibiotics or antibacterial ointments if a skin infection is present.  Antihistamine lotion or an antihistamine taken by mouth to ease itching.  Lubricants to keep moisture in your skin.  Burow's solution to reduce redness and soreness or to dry a weeping rash. Mix one packet or tablet of solution in 2 cups cool water. Dip a clean washcloth in the mixture, wring it out a bit, and put it on the affected area. Leave the cloth in place for 30 minutes. Do this as often as possible throughout the day.  Taking several cornstarch or baking soda baths daily if the area is too large to cover with a washcloth. Harsh chemicals, such as alkalis or acids, can cause skin damage that is like a burn. You should flush your skin for 15 to 20 minutes with cold water after such an exposure. You should also seek immediate medical care after exposure. Bandages (dressings), antibiotics, and pain medicine may be needed for severely irritated skin.  HOME CARE INSTRUCTIONS  Avoid the substance that caused your reaction.  Keep the area of skin that is affected away from hot water, soap, sunlight, chemicals, acidic substances, or anything else that would irritate your skin.  Do not scratch the rash. Scratching may cause the rash to become infected.  You may take cool baths to help stop the itching.  Only take over-the-counter or prescription medicines as directed by your caregiver.  See your caregiver for follow-up care as directed to make sure your skin is healing properly. In[place]  IF:   Your condition is not better after 3 days of treatment.  You seem to be getting worse.  You see signs of infection such as swelling, tenderness, redness, soreness, or warmth in the affected area.  You have any problems related to your  medicines. Document Released: 11/10/2000 Document Revised: 02/05/2012 Document Reviewed: 04/18/2011 [facility] Patient Information 2015 [place], [place]. This information is not intended to replace advice given to you by your health care provider. Make sure you discuss any questions you have with your health care provider.  Rash A rash is a change in the color or texture of your skin. There are many different types of rashes. You may have other problems that accompany your rash. CAUSES   Infections.  Allergic reactions. This can include allergies to pets or foods.  Certain medicines.  Exposure to certain chemicals, soaps, or cosmetics.  Heat.  Exposure to poisonous plants.  Tumors, both cancerous and noncancerous. SYMPTOMS   Redness.  Scaly skin.  Itchy skin.  Dry or cracked skin.  Bumps.  Blisters.  Pain. DIAGNOSIS  Your caregiver may do a physical exam to determine what type of rash you have. A skin sample (biopsy) may be taken and examined under a microscope. TREATMENT  Treatment depends on the type of rash you have. Your caregiver may prescribe certain medicines. For serious conditions, you may need to see a skin doctor (dermatologist). HOME CARE INSTRUCTIONS   Avoid the substance that caused your rash.  Do not scratch your rash. This can cause infection.  You may take cool baths to help stop itching.  Only take over-the-counter or prescription medicines as directed by your caregiver.  Keep all follow-up appointments as directed by your caregiver. SEEK IMMEDIATE MEDICAL CARE IF:  You have increasing pain, swelling, or redness.  You have a fever.  You have new or severe symptoms.  You have body aches, diarrhea, or vomiting.  Your rash is not better after 3 days. MAKE SURE YOU:  Understand these instructions.  Will watch your condition.  Will get help right away if you are not doing well or get worse. Document Released: 11/03/2002 Document Revised:  02/05/2012 Document Reviewed: 08/28/2011 [facility] Patient Information 2015 [place], [place]. This information is not intended to replace advice given to you by your health care provider. Make sure you discuss any questions you have with your health care provider.  Allergies  Allergies may happen from anything your body is sensitive to. This may be food, medicines, pollens, chemicals, and many other things. Food allergies can be severe and deadly.  HOME CARE  If you do not know what causes a reaction, keep a diary. Write down the foods you ate and the symptoms that followed. Avoid foods that cause reactions.  If you have red raised spots (hives) or a rash:  Take medicine as told by your doctor.  Use medicines for red raised spots and itching as needed.  Apply cold cloths (compresses) to the skin. Take a cool bath. Avoid hot baths or showers.  If you are severely allergic:  It is often necessary to go to the hospital after you have treated your reaction.  Wear your medical alert jewelry.  You and your family must learn how to give a allergy shot or use an allergy kit (anaphylaxis kit).  Always carry your allergy kit or shot with you. Use this medicine as told by your doctor if a severe reaction is occurring. GET HELP RIGHT AWAY IF:  You have trouble  breathing or are making high-pitched whistling sounds (wheezing).  You have a tight feeling in your chest or throat.  You have a puffy (swollen) mouth.  You have red raised spots, puffiness (swelling), or itching all over your body.  You have had a severe reaction that was helped by your allergy kit or shot. The reaction can return once the medicine has worn off.  You think you are having a food allergy. Symptoms most often happen within 30 minutes of eating a food.  Your symptoms have not gone away within 2 days or are getting worse.  You have new symptoms.  You want to retest yourself with a food or drink you think causes an  allergic reaction. Only do this under the care of a doctor. MAKE SURE YOU:   Understand these instructions.  Will watch your condition.  Will get help right away if you are not doing well or get worse. Document Released: 03/10/2013 Document Reviewed: 03/10/2013 [facility] [place] [facility] Patient Information 2015 [place]. This information is not intended to replace advice given to you by your health care provider. Make sure you discuss any questions you have with your health care provider.

## 2015-07-30 NOTE — ED Provider Notes (Signed)
CSN: [id number]     Arrival date & time 07/30/15  1812 History   First MD Initiated Contact with Patient 07/30/15 1952     Chief Complaint  Patient presents with  . Rash   (Consider location/radiation/quality/duration/timing/severity/associated sxs/prior Treatment) HPI Comments: 29 year old male began to notice small papules developing on his extremities and torso approximately one week ago. Over the ensuing days and in particular the past 3 days the papules have substantially increased in numbers and now cover all body surface areas. They are associated with minor itching. There are no vesicles. No bleeding or draining. There are no macular lesions. He denies constitutional symptoms. 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 Last week he was near painters were spraying paint near the vicinity and which he was working.   Past Medical History  Diagnosis Date  . Cardiomyopathy     released by cardiologist in 2007. Dr. Elizebeth Brooking  . Umbilical hernia    Past Surgical History  Procedure Laterality Date  . Wisdom tooth extraction     Family History  Problem Relation Age of Onset  . Cancer Maternal Grandmother     bladder   Social History  Substance Use Topics  . Smoking status: Never Smoker   . Smokeless tobacco: Never Used  . Alcohol Use: No    Review of Systems  Constitutional: Negative for fever, activity change and fatigue.  HENT: Negative.   Respiratory: Negative for cough, shortness of breath, wheezing and stridor.   Cardiovascular: Negative.   Skin: Positive for rash.  All other systems reviewed and are negative.   Allergies  Review of patient's allergies indicates no known allergies.  Home Medications   Prior to Admission medications   Medication Sig Start Date End Date Taking? Authorizing Provider  acetaminophen (TYLENOL) 500 MG tablet Take 1,000 mg by mouth every 6 (six) hours as needed (headache).    Historical Provider, MD    Meds Ordered and Administered this Visit   Medications  triamcinolone acetonide (KENALOG-40) injection 40 mg (not administered)    BP 138/84 mmHg  Pulse 100  Temp(Src) 98.3 F (36.8 C) (Oral)  Resp 16  SpO2 96% No data found.   Physical Exam  Constitutional: He is oriented to person, place, and time. He appears well-developed and well-nourished. No distress.  HENT:  Mouth/Throat: Oropharynx is clear and moist. No oropharyngeal exudate.  Eyes: Conjunctivae and EOM are normal.  Cardiovascular: Normal rate, regular rhythm and normal heart sounds.   Pulmonary/Chest: Effort normal and breath sounds normal. No respiratory distress.  Neurological: He is alert and oriented to person, place, and time.  Skin: Skin is warm and dry. Rash noted.  Multiple papules to all body surface areas. Flesh-colored. No vesicles. No discoloration. No erythema.  Psychiatric: He has a normal mood and affect.  Nursing note and vitals reviewed.   ED Course  Procedures (including critical care time)  Labs Review Labs Reviewed - No data to display  Imaging Review No results found.   Visual Acuity Review  Right Eye Distance:   Left Eye Distance:   Bilateral Distance:    Right Eye Near:   Left Eye Near:    Bilateral Near:         MDM   1. Papular rash, generalized   2. Contact dermatitis    Kenalog 40 mg IM Start the prednisone taper tomorrow Take allegra or Claritin daily     Hayden Rasmussen, NP  07/30/15 2018 

## 2015-09-13 ENCOUNTER — Other Ambulatory Visit: Payer: Self-pay | Admitting: Sports Medicine

## 2015-09-13 DIAGNOSIS — M25561 Pain in right knee: Secondary | ICD-10-CM

## 2015-09-23 ENCOUNTER — Other Ambulatory Visit: Payer: Self-pay | Admitting: Sports Medicine

## 2015-09-23 ENCOUNTER — Ambulatory Visit
Admission: RE | Admit: 2015-09-23 | Discharge: 2015-09-23 | Disposition: A | Discharge status: Home / Self Care | Payer: BC Managed Care – PPO | Source: Ambulatory Visit | Attending: Sports Medicine | Admitting: Sports Medicine

## 2015-09-23 DIAGNOSIS — M25561 Pain in right knee: Secondary | ICD-10-CM

## 2015-09-23 DIAGNOSIS — M25562 Pain in left knee: Secondary | ICD-10-CM

## 2015-09-25 ENCOUNTER — Other Ambulatory Visit: Payer: Self-pay

## 2015-10-20 DIAGNOSIS — I429 Cardiomyopathy, unspecified: Secondary | ICD-10-CM | POA: Insufficient documentation

## 2015-10-25 ENCOUNTER — Encounter: Payer: Self-pay | Admitting: Internal Medicine

## 2015-10-25 ENCOUNTER — Ambulatory Visit (INDEPENDENT_AMBULATORY_CARE_PROVIDER_SITE_OTHER): Payer: BC Managed Care – PPO | Admitting: Internal Medicine

## 2015-10-25 ENCOUNTER — Encounter: Payer: Self-pay | Admitting: *Deleted

## 2015-10-25 VITALS — BP 152/96 | HR 101 | Ht 73.0 in | Wt 393.4 lb

## 2015-10-25 DIAGNOSIS — I10 Essential (primary) hypertension: Secondary | ICD-10-CM

## 2015-10-25 DIAGNOSIS — R9431 Abnormal electrocardiogram [ECG] [EKG]: Secondary | ICD-10-CM | POA: Diagnosis not present

## 2015-10-25 NOTE — Progress Notes (Signed)
Cardiology Office Note   Date:  10/25/2015   ID:  Joe Deleon, DOB [birth year], MRN [id number]  PCP:  No PCP Per Patient  Cardiologist:   Dietrich Pates, MD   Preop evaluation   History of Present Illness: Joe Deleon is a 29 y.o. male who is being evaluated for knee surgery The pt was followed in the past by Dr Elizebeth Brooking [facility]) for possible hypertrophic cardiomyopathy He was not felt to have problems and was discharged from clinic in 2007 The pt is active   Plays basketball regularly Denies CP  Breathing is OK  No dizziness/syncope  No palpitations.         Current Outpatient Prescriptions  Medication Sig Dispense Refill  . acetaminophen (TYLENOL) 500 MG tablet Take 1,000 mg by mouth every 6 (six) hours as needed (headache).    Marland Kitchen HYDROCODONE-ACETAMINOPHEN PO Take by mouth. Pt unsure of dose and stated just takes this PRN for pain     No current facility-administered medications for this visit.    Allergies:   Review of patient's allergies indicates no known allergies.   Past Medical History  Diagnosis Date  . Cardiomyopathy [facility])     released by cardiologist in 2007. Dr. Elizebeth Brooking  . Umbilical hernia     Past Surgical History  Procedure Laterality Date  . Wisdom tooth extraction       Social History:  The patient  reports that he has never smoked. He has never used smokeless tobacco. He reports that he does not drink alcohol or use illicit drugs.   Family History:  The patient's family history includes Cancer in his maternal grandmother.    ROS:  Please see the history of present illness. All other systems are reviewed and  Negative to the above problem except as noted.    PHYSICAL EXAM: VS:  BP 152/96 mmHg  Pulse 101  Ht 6\' 1"  (1.854 m)  Wt 178.445 kg (393 lb 6.4 oz)  BMI 51.91 kg/m2  BP on my check 146/102  GEN: Mrbidly obese 29 yod, in no acute distress HEENT: normal Neck: no JVD, carotid bruits, or masses Cardiac: RRR; no murmurs,  rubs, or gallops,no edema  Respiratory:  clear to auscultation bilaterally, normal work of breathing GI: soft, nontender, nondistended, + BS  No hepatomegaly  MS: no deformity Moving all extremities   Skin: warm and dry, no rash Neuro:  Strength and sensation are intact Psych: euthymic mood, full affect   EKG:  EKG is ordered today.  ST 101  LAA  RAA   Lipid Panel No results found for: CHOL, TRIG, HDL, CHOLHDL, VLDL, LDLCALC, LDLDIRECT    Wt Readings from Last 3 Encounters:  10/25/15 178.445 kg (393 lb 6.4 oz)  09/23/15 158.759 kg (350 lb)  06/12/14 172.367 kg (380 lb)      ASSESSMENT AND PLAN:  1  Preop eval  Exam signif for HTN and morbid obesity  EKG with ST He is active  No symptoms  Overall I think he is at low risk for major cardiac event and OK to proceed with surgery  2.  Abnormal EKG  EKG with suggestion of atrial enlargement (R and L)  I would recomm an echo to evaluate  3  HTN  BP is high  When he returns for echo I would check  If high would begin Rx He may have sleep apnea  Would plan sleep study  After surgery  4.  Morbid obesity.  Wt is up  about 100 lb from 2007  Discussed diet  Needs to lose.     F/U in clinic after holidays    Signed, Dietrich Pates, MD  10/25/2015 4:42 PM    [facility] [address], [place], [place]  [address] Phone: [phone]; Fax: [phone]

## 2015-10-25 NOTE — Patient Instructions (Signed)
Medication Instructions:  No changes.  Labwork: None today  Testing/Procedures: Your physician has requested that you have an echocardiogram. Echocardiography is a painless test that uses sound waves to create images of your heart. It provides your doctor with information about the size and shape of your heart and how well your heart's chambers and valves are working. This procedure takes approximately one hour. There are no restrictions for this procedure.    Follow-Up: Your physician recommends that you schedule a follow-up appointment for a blood pressure check with the nurse when you come for the echocardiogram.   Your physician recommends that you schedule a follow-up appointment in: January 2017 with Dr Tenny Crawoss.         If you need a refill on your cardiac medications before your next appointment, please call your pharmacy.

## 2015-10-28 DIAGNOSIS — I2699 Other pulmonary embolism without acute cor pulmonale: Secondary | ICD-10-CM

## 2015-10-28 HISTORY — DX: Other pulmonary embolism without acute cor pulmonale: I26.99

## 2015-11-03 ENCOUNTER — Other Ambulatory Visit: Payer: Self-pay

## 2015-11-03 ENCOUNTER — Ambulatory Visit (HOSPITAL_COMMUNITY): Payer: BC Managed Care – PPO | Attending: Internal Medicine

## 2015-11-03 ENCOUNTER — Other Ambulatory Visit (HOSPITAL_COMMUNITY): Payer: BC Managed Care – PPO | Admitting: Cardiology

## 2015-11-03 DIAGNOSIS — R9431 Abnormal electrocardiogram [ECG] [EKG]: Secondary | ICD-10-CM

## 2015-11-03 DIAGNOSIS — I10 Essential (primary) hypertension: Secondary | ICD-10-CM

## 2015-11-03 DIAGNOSIS — Z6841 Body Mass Index (BMI) 40.0 and over, adult: Secondary | ICD-10-CM | POA: Insufficient documentation

## 2015-11-03 DIAGNOSIS — I071 Rheumatic tricuspid insufficiency: Secondary | ICD-10-CM | POA: Insufficient documentation

## 2015-11-03 DIAGNOSIS — I517 Cardiomegaly: Secondary | ICD-10-CM | POA: Diagnosis not present

## 2015-11-03 MED ORDER — PERFLUTREN LIPID MICROSPHERE
1.0000 mL | INTRAVENOUS | Status: DC | PRN
  Administered 2015-11-03: 2 mL via INTRAVENOUS

## 2015-11-04 ENCOUNTER — Ambulatory Visit: Payer: BC Managed Care – PPO | Admitting: Pharmacist

## 2015-11-04 ENCOUNTER — Other Ambulatory Visit (HOSPITAL_COMMUNITY): Payer: Self-pay

## 2015-11-10 ENCOUNTER — Encounter (HOSPITAL_COMMUNITY): Payer: Self-pay | Admitting: Vascular Surgery

## 2015-11-10 ENCOUNTER — Emergency Department (HOSPITAL_COMMUNITY): Payer: BC Managed Care – PPO

## 2015-11-10 ENCOUNTER — Emergency Department (HOSPITAL_COMMUNITY)
Admission: EM | Admit: 2015-11-10 | Discharge: 2015-11-10 | Disposition: A | Discharge status: Home / Self Care | Payer: BC Managed Care – PPO | Attending: Emergency Medicine | Admitting: Emergency Medicine

## 2015-11-10 DIAGNOSIS — R509 Fever, unspecified: Secondary | ICD-10-CM | POA: Insufficient documentation

## 2015-11-10 DIAGNOSIS — Z8679 Personal history of other diseases of the circulatory system: Secondary | ICD-10-CM | POA: Diagnosis not present

## 2015-11-10 DIAGNOSIS — Z8719 Personal history of other diseases of the digestive system: Secondary | ICD-10-CM | POA: Insufficient documentation

## 2015-11-10 DIAGNOSIS — R05 Cough: Secondary | ICD-10-CM | POA: Diagnosis present

## 2015-11-10 DIAGNOSIS — R042 Hemoptysis: Secondary | ICD-10-CM | POA: Diagnosis not present

## 2015-11-10 NOTE — Discharge Instructions (Signed)
Hemoptysis Hemoptysis, which means coughing up blood, can be a sign of a minor problem or a serious medical condition. The blood that is coughed up may come from the lungs and airways. Coughed-up blood can also come from bleeding that occurs outside the lungs and airways. Blood can drain into the windpipe during a severe nosebleed or when blood is vomited from the stomach. Because hemoptysis can be a sign of something serious, a medical evaluation is required. For some people with hemoptysis, no definite cause is ever identified. CAUSES  The most common cause of hemoptysis is bronchitis. Some other common causes include:   A ruptured blood vessel caused by coughing or an infection.   A medical condition that causes damage to the large air passageways (bronchiectasis).   A blood clot in the lungs (pulmonary embolism).   Pneumonia.   Tuberculosis.   Breathing in a small foreign object.   Cancer. For some people with hemoptysis, no definite cause is ever identified.  HOME CARE INSTRUCTIONS  Only take over-the-counter or prescription medicines as directed by your caregiver. Do not use cough suppressants unless your caregiver approves.  If your caregiver prescribes antibiotic medicines, take them as directed. Finish them even if you start to feel better.  Do not smoke. Also avoid secondhand smoke.  Follow up with your caregiver as directed. SEEK IMMEDIATE MEDICAL CARE IF:   You cough up bloody mucus for longer than a week.  You have a blood-producing cough that is severe or getting worse.  You have a blood-producing cough thatcomes and goes over time.  You develop problems with your breathing.   You vomit blood.  You develop bloody or black-colored stools.  You have chest pain.   You develop night sweats.  You feel faint or pass out.   You have a fever or persistent symptoms for more than 2-3 days.  You have a fever and your symptoms suddenly get worse. MAKE  SURE YOU:  Understand these instructions.  Will watch your condition.  Will get help right away if you are not doing well or get worse.   This information is not intended to replace advice given to you by your health care provider. Make sure you discuss any questions you have with your health care provider.   Document Released: 01/22/2002 Document Revised: 10/30/2012 Document Reviewed: 08/30/2012 Elsevier Interactive Patient Education 2016 [employer].  Emergency Department Resource Guide 1) Find a Doctor and Pay Out of Pocket Although you won't have to find out who is covered by your insurance plan, it is a good idea to ask around and get recommendations. You will then need to call the office and see if the doctor you have chosen will accept you as a new patient and what types of options they offer for patients who are self-pay. Some doctors offer discounts or will set up payment plans for their patients who do not have insurance, but you will need to ask so you aren't surprised when you get to your appointment.  2) Contact Your Local Health Department Not all health departments have doctors that can see patients for sick visits, but many do, so it is worth a call to see if yours does. If you don't know where your local health department is, you can check in your phone book. The CDC also has a tool to help you locate your state's health department, and many state websites also have listings of all of their local health departments.  3) Find a  Walk-in Clinic If your illness is not likely to be very severe or complicated, you may want to try a walk in clinic. These are popping up all over the country in pharmacies, drugstores, and shopping centers. They're usually staffed by nurse practitioners or physician assistants that have been trained to treat common illnesses and complaints. They're usually fairly quick and inexpensive. However, if you have serious medical issues or chronic medical  problems, these are probably not your best option.  No Primary Care Doctor: - Call Health Connect at  [phone] - they can help you locate a primary care doctor that  accepts your insurance, provides certain services, etc. - Physician Referral Service- [phone]  Chronic Pain Problems: Organization         Address  Phone   Notes  [facility]  [phone] Patients need to be referred by their primary care doctor.   Medication Assistance: Organization         Address  Phone   Notes  [facility] [address] [place], [place] [address] [phone] --Must be a resident of [facility] -- Must have NO insurance coverage whatsoever (no Medicaid/ Medicare, etc.) -- The pt. MUST have a primary care doctor that directs their care regularly and follows them in the community   [facility]ist  [phone]   [employer]  [phone]    Agencies that provide inexpensive medical care: Organization         Address  Phone   Notes  [facility]  [phone]   [facility]    [phone]) [phone]   [facility] [address] [place], [place] [address] [phone]) [phone]   [facility] [address], [place] [phone]   Planned Parenthood    [phone](336) [phone]   [facility]    [phone](336) [phone]   Community Health and [facility]  [address], [place] Phone:  [phone], Fax:  [phone]) [phone] Hours of Operation:  9 am - 6 pm, M-F.  Also accepts Medicaid/Medicare and self-pay.  [facility]  [address], [place] Phone: [phone]) [phone], Fax: [phone]. Hours of Operation:  8:30 am - 5:30 pm, M-F.  Also accepts Medicaid and self-pay.  [facility] [place] [address], [place] Phone: [phone]) [phone]   [facility] [address], [place], [place] [phone], Ext. [phone]  Mondays & Thursdays: 7-9 AM.  First 15 patients are seen on a first come, first serve basis.    Medicaid-accepting [facility] Providers:  Organization         Address  Phone   Notes  [facility] [address], [place] [phone]) [phone] Also accepts self-pay patients.  [facility] [address], [place]  [phone]) [phone]   [facility] [address], [place] [phone]) [phone]   [facility] [address] [place] [address], [place] [phone]) [phone]   Renaye RakersVeita Bland [address], [place]   [phone] Only accepts [place] Access [place] patients after they have their name applied to their card.   Self-Pay (no insurance) in [facility] [place]:  Organization         Address  Phone   Notes  Sickle Cell Patients, [facility] [address],  [place] [phone]   [facility] [address] [place], [place] [phone]   [facility] [place]  [address],  [phone]   [facility]/Dr. Osei-Bonsu  [address] [place] or [address], [place] [phone] Phone number for both [place] and [place] locations is the same.  [facility] [address], [place] [phone]   [facility] [address], [place] or [address] [phone]   [facility] [address] [place] [phone], phone; [phone], fax Sees patients 1st and 3rd Saturday of every month.  Must not qualify for public or private insurance (i.e. Medicaid, Medicare, [place] Health Choice, Veterans' Benefits)  Household income should be no more than 200% of the poverty level The clinic cannot treat you if you are pregnant or think you are pregnant  Sexually transmitted diseases are not treated at  the clinic.    Dental Care: Organization         Address  Phone  Notes  [facility] Department of [facility] [address] [place], [place] [phone] Accepts children up to age 53 who are enrolled in [place] or [place] Health Choice; pregnant women with a Medicaid card; and children who have applied for Medicaid or [place] Health Choice, but were declined, whose parents can pay a reduced fee at time of service.  [facility] Department of [facility]  [address], [place] [phone] Accepts children up to age 27 who are enrolled in [place] or [place] Health Choice; pregnant women with a Medicaid card; and children who have applied for Medicaid or [place] Health Choice, but were declined, whose parents can pay a reduced fee at time of service.  [facility]  [address] [place], [place] [phone] Patients are seen by appointment only. Walk-ins are not accepted. [facility] will see patients 66 years of age and older. Monday - Tuesday (8am-5pm) Most Wednesdays (8:30-5pm) $30 per visit, cash only  [facility] Adult Dental Access PROGRAM  [address], [facility] [phone] Patients are seen by appointment only. Walk-ins are not accepted. [facility] will see patients 59 years of age and older. One Wednesday Evening (Monthly: Volunteer Based).  $30 per visit, cash only  [employer]  [phone] for adults; Children under age 75, call Graduate Pediatric Dentistry at [phone]. Children aged 4-14, please call [phone] to request a pediatric application.  Dental services are provided in all areas of dental care including fillings, crowns and bridges, complete and partial dentures, implants, gum treatment, root canals, and extractions. Preventive care is also provided. Treatment is provided to both adults and children. Patients are selected via a lottery and there is often a  waiting list.   [facility] [address], [place]  [phone] [email or web address]   [facility] [address] [place], [place] [phone], Ext. [phone] Second and Fourth Thursday of each month, opens at 6:30 AM; Clinic ends at 9 AM.  Patients are seen on a first-come first-served basis, and a limited number are seen during each clinic.   [facility]  [address] [place], [place] [phone]   Eligibility Requirements You must have lived in [place], [place], or [place]  counties for at least the last three months.   You cannot be eligible for state or federal sponsored [place], including [employer], [place], or [employer].   You generally cannot be eligible for healthcare insurance through your employer.    How to apply: Eligibility screenings are held every Tuesday and Wednesday afternoon from 1:00 pm until 4:00 pm. You do not need an appointment for the interview!  [facility] [address] [place], [place] [phone]   [facility] Health Department  [phone]   [facility] Health Department  [phone]   [facility] Health Department  [phone]    Behavioral Health Resources in the Community: Intensive Outpatient Programs Organization         Address  Phone  Notes  [facility] Services [address] [place], [place] [phone]   [facility] Outpatient [address] [place], [place] [phone]   [facility] [address] [place], [place]  [phone]   [facility] Mental Health [address]  [place], [place] [phone] or [phone]   Substance Abuse Resources Organization         Address  Phone  Notes  Alcohol and Drug Services  [phone]   [facility]  [phone]   [facility]  [phone]   Floydene Flock  [phone]   Residential & Outpatient Substance Abuse  Program  [phone]   Psychological Services Organization         Address  Phone  Notes  [facility] Behavioral Health  [phone]   [facility] Services  [phone]   [facility] Mental Health [address] [place] [phone] or [phone]    Mobile Crisis Teams Organization         Address  Phone  Notes  [facility], Mobile Crisis Care Unit  [phone]   [facility]  [address] [place], [place] [phone]   Doristine Locks [address] [place] [phone]    Self-Help/Support Groups Organization         Address  Phone             Notes  [facility] [place] - variety of support groups  336- I7437963 Call for more information  Narcotics Anonymous (NA), [facility] [address], [employer] [place]  2 meetings at this location   Statistician         Address  Phone  Notes  [facility] [address],    [place]  [phone]   [facility]  [address] [place] [address], [place], [place] [phone]   [facility] [address] [place], [place] [phone] Admissions: 8am-3pm M-F  [facility] [address],    [place], [place] [phone]   [facility] [address] [place], [place], [place] [phone]   [facility] [address],  [place], [place] [phone]   [facility] - Intensive Outpatient [address], [place], [place] [phone]   [facility] (Addiction Recovery [facility] [address] [place]  [place], [place] [phone] or [phone]   [facility] ([facility]) [address] [place], [place] [phone] Accepts Medicaid  [facility] [place] [address],  [place] [phone] Substance Abuse/Addiction Treatment   [facility] Resources Organization          Address  Phone  Notes  Estate manager/land agent Services  (  [phone]   Domenic Schwab, PhD [address] [place], [place]   [phone] or [phone]   [place], [place] [phone]   [address], [place], [place] [phone] Insurance/Medicaid/sponsorship through [facility] [address]                                    [place], [place] [phone] [place] [address], [place] [phone]    Dr. Adele Schilder  [phone]   [facility] Dept. 1) [address], [place] 2) [place] 3)  [address], [place] [phone]  [phone]   [place] [phone] or [phone] (After Hours)

## 2015-11-10 NOTE — ED Provider Notes (Signed)
CSN: [id number]     Arrival date & time 11/10/15  1156 History  By signing my name below, I, Joe Deleon, attest that this documentation has been prepared under the direction and in the presence of Federated Department StoresHanna Patel-Mills, PA-C. Electronically Signed: Placido SouLogan Deleon, ED Scribe. 11/10/2015. 1:17 PM.   Chief Complaint  Patient presents with  . Cough   The history is provided by the patient. No language interpreter was used.    HPI Comments: Joe BargesBrandon Deleon is a 29 y.o. male who presents to the Emergency Department complaining of mild hemoptysis with onset earlier today. Pt notes 2x occurences with 1x this morning which was minimal and a 1x later in the morning which he says included a significant amount of dark blood. He notes associated fever (TMAX 99.9 F last night and 97.7 F in triage). Pt notes a hx of similar symptoms further noting he was dx with pneumonia when they occurred. Pt notes having surgery to the ACL and meniscus in his left knee 2 days ago further noting a follow up appointment in 5 days. He notes currently taking his post-op rx's as prescribed. He denies a hx of HTN or DM. Pt denies SOB.  Past Medical History  Diagnosis Date  . Cardiomyopathy [facility](HCC)     released by cardiologist in 2007. Dr. Elizebeth Brookingotton  . Umbilical hernia    Past Surgical History  Procedure Laterality Date  . Wisdom tooth extraction    . Knee surgery Left    Family History  Problem Relation Age of Onset  . Cancer Maternal Grandmother     bladder   Social History  Substance Use Topics  . Smoking status: Never Smoker   . Smokeless tobacco: Never Used  . Alcohol Use: No    Review of Systems  Constitutional: Positive for fever.  Respiratory: Positive for cough (hemoptysis). Negative for shortness of breath.   All other systems reviewed and are negative.  Allergies  Review of patient's allergies indicates no known allergies.  Home Medications   Prior to Admission medications   Medication Sig Start  Date End Date Taking? Authorizing Provider  acetaminophen (TYLENOL) 500 MG tablet Take 1,000 mg by mouth every 6 (six) hours as needed (headache).    Historical Provider, MD  HYDROCODONE-ACETAMINOPHEN PO Take by mouth. Pt unsure of dose and stated just takes this PRN for pain    Historical Provider, MD   BP 137/75 mmHg  Pulse 101  Temp(Src) 98.1 F (36.7 C) (Oral)  Resp 18  SpO2 94% Physical Exam  Constitutional: He is oriented to person, place, and time. He appears well-developed and well-nourished.  HENT:  Head: Normocephalic and atraumatic.  Mouth/Throat: No oropharyngeal exudate.  Neck: Normal range of motion. No tracheal deviation present.  Cardiovascular: Normal rate, regular rhythm and normal heart sounds.   Regular rate and rhythm. No murmur.  Pulmonary/Chest: Effort normal and breath sounds normal. No respiratory distress. He has no wheezes. He has no rales. He exhibits no tenderness.  Lungs clear to auscultation bilaterally; no wheezes or rhonchi; no oropharyngeal swelling or blood noted in mouth  Abdominal: Soft. There is no tenderness.  Musculoskeletal: Normal range of motion.  Neurological: He is alert and oriented to person, place, and time.  Skin: Skin is warm and dry. He is not diaphoretic.  Psychiatric: He has a normal mood and affect. His behavior is normal.  Nursing note and vitals reviewed.  ED Course  Procedures  DIAGNOSTIC STUDIES: Oxygen Saturation is 94% on RA, normal  by my interpretation.    COORDINATION OF CARE: 1:16 PM Pt presents today due to an occurrence of hemoptysis earlier today. Discussed next steps with pt including a DG of the chest and reevaluation based on the results. Pt agreed to plan.   Labs Review Labs Reviewed - No data to display  Imaging Review No results found. I have personally reviewed and evaluated these images as part of my medical decision-making.   EKG Interpretation None      MDM   Final diagnoses:  Hemoptysis  Pt  here for hemoptysis that began earlier today. He is afebrile in the ED. I do not believe that his hemoptysis is related to cancer or pneumonia. His chest x-ray was negative for pneumothorax, pneumonia, or pleural effusion. He denies any coughing or shortness of breath. Any recent travel outside of the country. I had recent surgery on the left knee but I do not suspect a PE. He had no hemoptysis while in the ED. He states he has an appointment in 2 days with a new primary care physician. Return precautions were discussed with the patient and mom and they verbally agree with the plan. I personally performed the services described in this documentation, which was scribed in my presence. The recorded information has been reviewed and is accurate.   Catha Gosselin, PA-C 11/12/15 1542  Benjiman Core, MD 11/12/15 [phone]

## 2015-11-10 NOTE — ED Notes (Addendum)
Pt reports to the ED for eval of hemoptysis. Pt reports this morning when he woke up he coughed and a small amount of blood was noted. Then he had another coughing spell and coughed a large amount of dark red blood. Pt denies any recent cough. Pt had a fever around 1800 last pm. Pt denies any N/V. Had knee surgery on Monday by Dr. Eulah PontMurphy. Denies any CP or SOB. Pt A&Ox4, resp e/u, and skin warm and moist.

## 2015-11-12 DIAGNOSIS — G479 Sleep disorder, unspecified: Secondary | ICD-10-CM | POA: Insufficient documentation

## 2015-11-15 ENCOUNTER — Encounter (HOSPITAL_COMMUNITY): Payer: Self-pay | Admitting: Emergency Medicine

## 2015-11-15 ENCOUNTER — Emergency Department (HOSPITAL_COMMUNITY): Payer: BC Managed Care – PPO

## 2015-11-15 ENCOUNTER — Observation Stay (HOSPITAL_COMMUNITY)
Admission: EM | Admit: 2015-11-15 | Discharge: 2015-11-16 | Disposition: A | Discharge status: Home / Self Care | Payer: BC Managed Care – PPO | Attending: Oncology | Admitting: Oncology

## 2015-11-15 DIAGNOSIS — J9811 Atelectasis: Secondary | ICD-10-CM | POA: Diagnosis not present

## 2015-11-15 DIAGNOSIS — R0989 Other specified symptoms and signs involving the circulatory and respiratory systems: Secondary | ICD-10-CM | POA: Diagnosis not present

## 2015-11-15 DIAGNOSIS — I2699 Other pulmonary embolism without acute cor pulmonale: Principal | ICD-10-CM | POA: Diagnosis present

## 2015-11-15 DIAGNOSIS — I429 Cardiomyopathy, unspecified: Secondary | ICD-10-CM

## 2015-11-15 DIAGNOSIS — Z86711 Personal history of pulmonary embolism: Secondary | ICD-10-CM | POA: Diagnosis not present

## 2015-11-15 DIAGNOSIS — R0602 Shortness of breath: Secondary | ICD-10-CM | POA: Diagnosis present

## 2015-11-15 DIAGNOSIS — R000 Tachycardia, unspecified: Secondary | ICD-10-CM | POA: Diagnosis not present

## 2015-11-15 DIAGNOSIS — I517 Cardiomegaly: Secondary | ICD-10-CM | POA: Diagnosis not present

## 2015-11-15 DIAGNOSIS — R42 Dizziness and giddiness: Secondary | ICD-10-CM | POA: Insufficient documentation

## 2015-11-15 DIAGNOSIS — Z6841 Body Mass Index (BMI) 40.0 and over, adult: Secondary | ICD-10-CM | POA: Diagnosis not present

## 2015-11-15 DIAGNOSIS — K429 Umbilical hernia without obstruction or gangrene: Secondary | ICD-10-CM | POA: Insufficient documentation

## 2015-11-15 HISTORY — DX: Adverse effect of unspecified anesthetic, initial encounter: T41.45XA

## 2015-11-15 HISTORY — DX: Other pulmonary embolism without acute cor pulmonale: I26.99

## 2015-11-15 HISTORY — DX: Other complications of anesthesia, initial encounter: T88.59XA

## 2015-11-15 LAB — COMPREHENSIVE METABOLIC PANEL
ALK PHOS: 74 U/L (ref 38–126)
ALT: 27 U/L (ref 17–63)
AST: 25 U/L (ref 15–41)
Albumin: 2.9 g/dL — ABNORMAL LOW (ref 3.5–5.0)
Anion gap: 7 (ref 5–15)
BILIRUBIN TOTAL: 0.6 mg/dL (ref 0.3–1.2)
BUN: 8 mg/dL (ref 6–20)
CALCIUM: 9 mg/dL (ref 8.9–10.3)
CO2: 28 mmol/L (ref 22–32)
CREATININE: 1.12 mg/dL (ref 0.61–1.24)
Chloride: 102 mmol/L (ref 101–111)
Glucose, Bld: 116 mg/dL — ABNORMAL HIGH (ref 65–99)
Potassium: 4.4 mmol/L (ref 3.5–5.1)
Sodium: 137 mmol/L (ref 135–145)
TOTAL PROTEIN: 7.5 g/dL (ref 6.5–8.1)

## 2015-11-15 LAB — CBC WITH DIFFERENTIAL/PLATELET
BASOS PCT: 0 %
Basophils Absolute: 0 10*3/uL (ref 0.0–0.1)
Eosinophils Absolute: 0.3 10*3/uL (ref 0.0–0.7)
Eosinophils Relative: 3 %
HCT: 47.8 % (ref 39.0–52.0)
HEMOGLOBIN: 14.9 g/dL (ref 13.0–17.0)
Lymphocytes Relative: 21 %
Lymphs Abs: 2.2 10*3/uL (ref 0.7–4.0)
MCH: 26.5 pg (ref 26.0–34.0)
MCHC: 31.2 g/dL (ref 30.0–36.0)
MCV: 85.1 fL (ref 78.0–100.0)
MONO ABS: 1 10*3/uL (ref 0.1–1.0)
Monocytes Relative: 9 %
NEUTROS PCT: 67 %
Neutro Abs: 7.1 10*3/uL (ref 1.7–7.7)
PLATELETS: 146 10*3/uL — AB (ref 150–400)
RBC: 5.62 MIL/uL (ref 4.22–5.81)
RDW: 15 % (ref 11.5–15.5)
WBC: 10.6 10*3/uL — ABNORMAL HIGH (ref 4.0–10.5)

## 2015-11-15 LAB — D-DIMER, QUANTITATIVE (NOT AT ARMC): D DIMER QUANT: 2.32 ug{FEU}/mL — AB (ref 0.00–0.50)

## 2015-11-15 MED ORDER — GUAIFENESIN-DM 100-10 MG/5ML PO SYRP
5.0000 mL | ORAL_SOLUTION | ORAL | Status: DC | PRN
  Administered 2015-11-15 – 2015-11-16 (×2): 5 mL via ORAL
  Filled 2015-11-15 (×3): qty 5

## 2015-11-15 MED ORDER — WARFARIN - PHYSICIAN DOSING INPATIENT
Freq: Every day | Status: DC
  Administered 2015-11-15: 18:00:00

## 2015-11-15 MED ORDER — RIVAROXABAN 20 MG PO TABS: 20.0000 mg | ORAL_TABLET | Freq: Every day | ORAL | Status: DC

## 2015-11-15 MED ORDER — IOHEXOL 350 MG/ML SOLN
100.0000 mL | Freq: Once | INTRAVENOUS | Status: AC | PRN
  Administered 2015-11-15: 100 mL via INTRAVENOUS

## 2015-11-15 MED ORDER — RIVAROXABAN 15 MG PO TABS: 15.0000 mg | ORAL_TABLET | Freq: Two times a day (BID) | ORAL | Status: DC

## 2015-11-15 MED ORDER — ENOXAPARIN SODIUM 150 MG/ML ~~LOC~~ SOLN
1.0000 mg/kg | Freq: Once | SUBCUTANEOUS | Status: AC
  Administered 2015-11-15: 175 mg via SUBCUTANEOUS
  Filled 2015-11-15: qty 1.15

## 2015-11-15 MED ORDER — COUMADIN BOOK
Freq: Once | Status: AC
Start: 2015-11-15 — End: 2015-11-15
  Administered 2015-11-15: 16:00:00
  Filled 2015-11-15: qty 1

## 2015-11-15 MED ORDER — ENOXAPARIN SODIUM 150 MG/ML ~~LOC~~ SOLN
1.0000 mg/kg | Freq: Two times a day (BID) | SUBCUTANEOUS | Status: DC
  Administered 2015-11-15: 175 mg via SUBCUTANEOUS
  Filled 2015-11-15 (×3): qty 1.15

## 2015-11-15 MED ORDER — WARFARIN SODIUM 7.5 MG PO TABS
7.5000 mg | ORAL_TABLET | Freq: Every day | ORAL | Status: DC
  Administered 2015-11-15: 7.5 mg via ORAL
  Filled 2015-11-15: qty 1

## 2015-11-15 MED ORDER — METHOCARBAMOL 500 MG PO TABS: 500.0000 mg | ORAL_TABLET | Freq: Four times a day (QID) | ORAL | Status: DC | PRN

## 2015-11-15 MED ORDER — SENNOSIDES-DOCUSATE SODIUM 8.6-50 MG PO TABS: 1.0000 | ORAL_TABLET | Freq: Every evening | ORAL | Status: DC | PRN

## 2015-11-15 MED ORDER — WARFARIN VIDEO
Freq: Once | Status: AC
  Administered 2015-11-15: 16:00:00

## 2015-11-15 NOTE — H&P (Signed)
Date: 11/15/2015               Patient Name:  Joe Deleon MRN: [id number]  DOB: [birth year] Age / Sex: 29 y.o., male   PCP: Tammy Eartha Inch, MD           Medical Service: Internal Medicine Teaching Service         Attending Physician: Dr. Levert Feinstein, MD    First Contact: Dr. Ladona Ridgel, MD Pager: [phone]  Second Contact: Dr. Senaida Ores, MD Pager: [phone]       After Hours (After 5p/  First Contact Pager: [phone]  weekends / holidays): Second Contact Pager: [phone]    Most Recent Discharge Date:  11/10/15  Chief Complaint:  Chief Complaint  Patient presents with  . Shortness of Breath       History of Present Illness:  Joe Deleon is a very pleasant 29 y.o. male with a PMH of cardiomyopathy (saw Dr. Ross/cardiology on 10/25/15 and was cleared for knee surgery), morbidly obese, umbilical hernia, meniscal tear, who presents with SOB.  He was in the ED on 12/14 for mild hemoptysis and denied SOB at that time.  Pt had meniscal surgery (Dr. Eulah Pont) on left knee on 12/12.  States he has not been up moving around a lot since his surgery.    Today, around 12:30AM he developed sudden SOB.  Denies any CP, hemoptysis, LE swelling.  Has not experienced much pain in his knee since the surgery.  Denies any calf pain or LE swelling.  His only medications were methocarbamol and percocet.  He has been doing some knee exercises prescribed by Dr. Eulah Pont and is scheduled to have a post-op follow up appointment with him tomorrow around 11AM.  No personal history of previous blood clots.  Mother states she had a blood clot previously and was on coumadin.  He recently established with a PCP, Dr. Leavy Cella in [facility].    In the ED, d-dimer was elevated at 2.32 with CTA chest showing moderate to high level of suspeicion for PE in the RLLL but not 100% certainty.  No evidence of right heart strain.  EKG without acute changes.  VSS.  He was given a dose of lovenox in the ED.    Meds: Current  Facility-Administered Medications  Medication Dose Route Frequency Provider Last Rate Last Dose  . methocarbamol (ROBAXIN) tablet 500 mg  500 mg Oral Q6H PRN Marrian Salvage, MD      . Rivaroxaban Carlena Hurl) tablet 15 mg  15 mg Oral BID Almon Hercules, [facility]       Followed by  . [START ON 12/06/2015] rivaroxaban (XARELTO) tablet 20 mg  20 mg Oral Q supper Almon Hercules, [facility]      . senna-docusate (Senokot-S) tablet 1 tablet  1 tablet Oral QHS PRN Marrian Salvage, MD        Prescriptions prior to admission  Medication Sig Dispense Refill Last Dose  . methocarbamol (ROBAXIN) 500 MG tablet Take 500 mg by mouth every 6 (six) hours as needed for muscle spasms.    11/14/2015 at Unknown time  . oxyCODONE-acetaminophen (PERCOCET/ROXICET) 5-325 MG tablet Take 1 tablet by mouth every 6 (six) hours as needed for moderate pain.    11/14/2015 at Unknown time    Allergies: Allergies as of 11/15/2015  . (No Known Allergies)    PMH: Past Medical History  Diagnosis Date  . Cardiomyopathy [facility])     released by cardiologist in 2007. Dr.  Cotton  . Umbilical hernia   . Complication of anesthesia   . PONV (postoperative nausea and vomiting)   . Family history of adverse reaction to anesthesia     MOTHER HAS NAUSEA   . PE (pulmonary embolism) 10/2015    PSH: Past Surgical History  Procedure Laterality Date  . Wisdom tooth extraction    . Knee surgery Left     FH: Family History  Problem Relation Age of Onset  . Cancer Maternal Grandmother     bladder    SH: Social History  Substance Use Topics  . Smoking status: Never Smoker   . Smokeless tobacco: Never Used  . Alcohol Use: No    Review of Systems: Pertinent items are noted in HPI.  Physical Exam: BP 130/95 mmHg  Pulse 103  Temp(Src) 98 F (36.7 C) (Oral)  Resp 18  Ht 6\' 1"  (1.854 m)  Wt 171.6 kg (378 lb 5 oz)  BMI 49.92 kg/m2  SpO2 97%  Physical Exam Constitutional: Vital signs reviewed.  Patient is a well-developed and  well-nourished male in no acute distress sitting on the edge of the bed.  He is cooperative with my exam.  Head: Normocephalic and atraumatic Eyes: PERRL, EOMI, conjunctivae normal, no scleral icterus.  Neck: Supple, Trachea midline .  Cardiovascular: RRR, no MRG. Pulmonary/Chest: normal respiratory effort, CTAB, no wheezes, rales, or rhonchi Abdominal: Obese.  Soft. Non-tender, non-distended, bowel sounds are normal. Neurological: A&O x3, cranial nerve II-XII are grossly intact, moving all extremities.   Skin: Warm, dry and intact.   Extremities: RLE without swelling.  LLE bandaged since surgery but may appear more swollen than the RLE.  Mild calf TTP bilaterally.   Psychiatric: Normal mood and affect.    Lab results:  Basic Metabolic Panel:  Recent Labs  82/95/62 0348  NA 137  K 4.4  CL 102  CO2 28  GLUCOSE 116*  BUN 8  CREATININE 1.12  CALCIUM 9.0    Calcium/Magnesium/Phosphorus:  Recent Labs Lab 11/15/15 0348  CALCIUM 9.0    Liver Function Tests:  Recent Labs  11/15/15 0348  AST 25  ALT 27  ALKPHOS 74  BILITOT 0.6  PROT 7.5  ALBUMIN 2.9*   No results for input(s): LIPASE, AMYLASE in the last 72 hours. No results for input(s): AMMONIA in the last 72 hours.  CBC: Lab Results  Component Value Date   WBC 10.6* 11/15/2015   HGB 14.9 11/15/2015   HCT 47.8 11/15/2015   MCV 85.1 11/15/2015   PLT 146* 11/15/2015    Lipase: No results found for: LIPASE  Lactic Acid/Procalcitonin: No results for input(s): LATICACIDVEN, PROCALCITON, O2SATVEN in the last 168 hours.  Cardiac Enzymes: No results for input(s): TROPIPOC in the last 72 hours. No results found for: CKTOTAL, CKMB, CKMBINDEX, TROPONINI  BNP: No results for input(s): PROBNP in the last 72 hours.  D-Dimer:  Recent Labs  11/15/15 0348  DDIMER 2.32*    CBG: No results for input(s): GLUCAP in the last 72 hours.  Hemoglobin A1C: No results for input(s): HGBA1C in the last 72  hours.  Lipid Panel: No results for input(s): CHOL, HDL, LDLCALC, TRIG, CHOLHDL, LDLDIRECT in the last 72 hours.  Thyroid Function Tests: No results for input(s): TSH, T4TOTAL, FREET4, T3FREE, THYROIDAB in the last 72 hours.  Anemia Panel: No results for input(s): VITAMINB12, FOLATE, FERRITIN, TIBC, IRON, RETICCTPCT in the last 72 hours.  Coagulation: No results for input(s): LABPROT, INR in the last 72 hours.  Urine  Drug Screen: Drugs of Abuse:  No results found for: LABOPIA, COCAINSCRNUR, LABBENZ, AMPHETMU, THCU, LABBARB  Alcohol Level: No results for input(s): ETH in the last 72 hours.  Urinalysis: No results found for: COLORURINE, APPEARANCEUR, LABSPEC, PHURINE, GLUCOSEU, HGBUR, BILIRUBINUR, KETONESUR, PROTEINUR, UROBILINOGEN, NITRITE, LEUKOCYTESUR  Imaging results:  Dg Chest 2 View  11/15/2015  CLINICAL DATA:  Initial evaluation for shortness of breath. EXAM: CHEST  2 VIEW COMPARISON:  Prior study from 11/10/2015. FINDINGS: Cardiomegaly is stable. Mediastinal silhouette within normal limits. Lungs are mildly hypoinflated. Mild pulmonary vascular congestion without overt pulmonary edema. No consolidative airspace disease. No pleural effusion. No pneumothorax. No acute osseus abnormality. IMPRESSION: 1. Stable cardiomegaly with mild perihilar vascular congestion without overt pulmonary edema. 2. No other active cardiopulmonary disease. Electronically Signed   By: Rise Mu M.D.   On: 11/15/2015 04:17   Ct Angio Chest Pe W/cm &/or Wo Cm  11/15/2015  CLINICAL DATA:  Recent orthopedic surgery. Acute onset of shortness of breath. Elevated D-dimer. EXAM: CT ANGIOGRAPHY CHEST WITH CONTRAST TECHNIQUE: Multidetector CT imaging of the chest was performed using the standard protocol during bolus administration of intravenous contrast. Multiplanar CT image reconstructions and MIPs were obtained to evaluate the vascular anatomy. CONTRAST:  OMNIPAQUE IOHEXOL 350 MG/ML SOLN  COMPARISON:  Radiography same day FINDINGS: Pulmonary arterial opacification is moderate. No large central emboli are seen. There is high suspicion of a small embolus within a right lower lobe pulmonary arterial branch. Because of the moderate opacification, this is not established with absolute certainty, but I think is probably present. The lungs are clear except for mild patchy density at the right base. No evidence of right heart strain. No mediastinal or hilar mass or lymphadenopathy. No aortic pathology. Scans in the upper abdomen are unremarkable. Review of the MIP images confirms the above findings. IMPRESSION: Moderate to high level of suspicion for pulmonary emboli in the right lower lobe pulmonary arterial branches. Clot burden is not large. Certainty is not 100% based on only moderate contrast opacification. However, I think the diagnosis is likely. Mild patchy atelectasis at the right base laterally. Electronically Signed   By: Paulina Fusi M.D.   On: 11/15/2015 07:00    EKG: EKG Interpretation  Date/Time:  Monday November 15 2015 02:49:37 EST Ventricular Rate:  100 PR Interval:  143 QRS Duration: 85 QT Interval:  330 QTC Calculation: 426 R Axis:   81 Text Interpretation:  Sinus tachycardia Biatrial enlargement Borderline repolarization abnormality Confirmed by [facility] MD, Barbara Cower [phone]) on 11/15/2015 5:05:38 AM   Antibiotics: Antibiotics Given (last 72 hours)    None      Anti-infectives    None     Consults:     Assessment & Plan by Problem: Principal Problem:   Pulmonary embolus (HCC) Active Problems:   Cardiomyopathy (HCC)   Morbidly obese (HCC)  Pulmonary Embolism Pt p/w SOB, previous hemoptysis, mild tachycardia with elevated d-dimer.  Recent knee surgery on 12/12.  CTA chest: moderate to high level of suspicion for pulmonary emboli in the right lower lobe pulmonary arterial branches. Clot burden is not large. Certainty is not 100% based on only moderate  contrast opacification. However, I think the diagnosis is likely. Mild patchy atelectasis at the right base laterally.  No evidence of right heart strain.  Recent echo on 12/7 with LVEF: 60-65% with grade 1 DD.   -begin xarelto -likely d/c tomorrow and f/u with Dr. Leavy Cella   Cardiomyopathy Seen by Dr. Tenny Craw on 11/28 and  cleared for surgery.  Echo 12/7 with normal EF and grade 1 DD.   -f/u with Dr. Tenny Craw after holidays   ?Obstructive sleep apnea  -sleep study as outpatient   FEN  Fluids-None, tolerating diet  Electrolytes-Normal, replete PRN  Nutrition-HH/Carb Mod  VTE prophylaxis  -xarelto for PE  Disposition Anticipated discharge today or tomorrow.     -consult care management -consult social work   Emergency IT trainer Information    Name Relation Home Work Mobile   Jameson Mother [phone]        The patient does have a current PCP (Tammy Eartha Inch, MD) and does need an [facility] hospital follow-up appointment after discharge.  Signed Marrian Salvage, MD PGY-3, Internal Medicine Teaching Service 11/15/2015, 9:27 AM

## 2015-11-15 NOTE — Progress Notes (Signed)
ANTICOAGULATION CONSULT NOTE - Initial Consult  Pharmacy Consult for Rivaroxaban Indication: pulmonary embolus  No Known Allergies  Patient Measurements: Height: 6\' 1"  (185.4 cm) Weight: (!) 380 lb (172.367 kg) IBW/kg (Calculated) : 79.9  Vital Signs: Temp: 98.7 F (37.1 C) (12/19 0247) Temp Source: Oral (12/19 0247) BP: 142/77 mmHg (12/19 0719) Pulse Rate: 93 (12/19 0730)  Labs:  Recent Labs  11/15/15 0348  HGB 14.9  HCT 47.8  PLT 146*  CREATININE 1.12    Estimated Creatinine Clearance: 163.8 mL/min (by C-G formula based on Cr of 1.12).   Medical History: Past Medical History  Diagnosis Date  . Cardiomyopathy [facility](HCC)     released by cardiologist in 2007. Dr. Elizebeth Brookingotton  . Umbilical hernia     Assessment: 29 yom with SOB, likely PE in RLL on CTA, low clot burden. Pharmacy consulted to dose Xarelto. Hg wnl, plt low 146 on admit. Given ~1mg /kg treatment dose of lovenox x 1 dose at ~0751 this AM - recommendation to start Xarelto 0-2 hours prior to next dose of lovenox being due. No bleed documented. No anticoag noted pta. CrCl>100.  Goal of Therapy:  Adequate PE treatment Monitor platelets by anticoagulation protocol: Yes   Plan:  Start Xarelto at 1800 tonight (~2h prior to next dose of LMWH due) Xarelto 15mg  PO BID x 21 days; then 20mg  daily (on 12/06/15) Mon CBC, s/sx bleeding  Babs BertinHaley Aarin Sparkman, PharmD, [facility] CenterBCPS Clinical Pharmacist Pager [phone] 11/15/2015 8:43 AM

## 2015-11-15 NOTE — ED Notes (Signed)
IV attempt X2 

## 2015-11-15 NOTE — ED Notes (Signed)
Pt to CT

## 2015-11-15 NOTE — Plan of Care (Signed)
Problem: Education: Goal: Knowledge of [facility] Education information/materials will improve Outcome: Completed/Met Date Met:  11/15/15 Patient educated about coumadin. Coumadin booklet given to patient. Coumadin video watched by patient. Questions answered. Patient verbalized understanding of teaching.   Problem: Safety: Goal: Ability to remain free from injury will improve Outcome: Progressing  No falls or injuries this shift. Patient educated about fall prevention. Patient's gait is steady

## 2015-11-15 NOTE — Progress Notes (Signed)
Insurance check completed for Xarelto- Per rep at [facility]:   Xarelto: 90 day at retail $138.00/ 30 day retail $46.00, no auth required   Patient can use: [facility], [facility], [address] [facility], [facility], [employer], [employer]

## 2015-11-15 NOTE — ED Notes (Signed)
IV attempted via Ultrasound guidance x1 without success. MD informed.

## 2015-11-15 NOTE — Discharge Instructions (Addendum)
1. Inject 1.7 ml Lovenox (170 mg) into skin every 12 hours. 2. Go to your PCP office on Friday, 12/23, to have your blood checked (INR check). 3. Take Warfarin 5 mg nightly.      Information on my medicine - Coumadin   (Warfarin)  This medication education was reviewed with me or my healthcare representative as part of my discharge preparation.  The pharmacist that spoke with me during my hospital stay was:  Almon HerculesBaird, Haley P, [facility] CenterRPH  Why was Coumadin prescribed for you? Coumadin was prescribed for you because you have a blood clot or a medical condition that can cause an increased risk of forming blood clots. Blood clots can cause serious health problems by blocking the flow of blood to the heart, lung, or brain. Coumadin can prevent harmful blood clots from forming. As a reminder your indication for Coumadin is:   Select from menu  What test will check on my response to Coumadin? While on Coumadin (warfarin) you will need to have an INR test regularly to ensure that your dose is keeping you in the desired range. The INR (international normalized ratio) number is calculated from the result of the laboratory test called prothrombin time (PT).  If an INR APPOINTMENT HAS NOT ALREADY BEEN MADE FOR YOU please schedule an appointment to have this lab work done by your health care provider within 7 days. Your INR goal is usually a number between:  2 to 3 or your provider may give you a more narrow range like 2-2.5.  Ask your health care provider during an office visit what your goal INR is.  What  do you need to  know  About  COUMADIN? Take Coumadin (warfarin) exactly as prescribed by your healthcare provider about the same time each day.  DO NOT stop taking without talking to the doctor who prescribed the medication.  Stopping without other blood clot prevention medication to take the place of Coumadin may increase your risk of developing a new clot or stroke.  Get refills before you run out.  What do  you do if you miss a dose? If you miss a dose, take it as soon as you remember on the same day then continue your regularly scheduled regimen the next day.  Do not take two doses of Coumadin at the same time.  Important Safety Information A possible side effect of Coumadin (Warfarin) is an increased risk of bleeding. You should call your healthcare provider right away if you experience any of the following: ? Bleeding from an injury or your nose that does not stop. ? Unusual colored urine (red or dark brown) or unusual colored stools (red or black). ? Unusual bruising for unknown reasons. ? A serious fall or if you hit your head (even if there is no bleeding).  Some foods or medicines interact with Coumadin (warfarin) and might alter your response to warfarin. To help avoid this: ? Eat a balanced diet, maintaining a consistent amount of Vitamin K. ? Notify your provider about major diet changes you plan to make. ? Avoid alcohol or limit your intake to 1 drink for women and 2 drinks for men per day. (1 drink is 5 oz. wine, 12 oz. beer, or 1.5 oz. liquor.)  Make sure that ANY health care provider who prescribes medication for you knows that you are taking Coumadin (warfarin).  Also make sure the healthcare provider who is monitoring your Coumadin knows when you have started a new medication including herbals  and non-prescription products.  Coumadin (Warfarin)  Major Drug Interactions  Increased Warfarin Effect Decreased Warfarin Effect  Alcohol (large quantities) Antibiotics (esp. Septra/Bactrim, Flagyl, Cipro) Amiodarone (Cordarone) Aspirin (ASA) Cimetidine (Tagamet) Megestrol (Megace) NSAIDs (ibuprofen, naproxen, etc.) Piroxicam (Feldene) Propafenone (Rythmol SR) Propranolol (Inderal) Isoniazid (INH) Posaconazole (Noxafil) Barbiturates (Phenobarbital) Carbamazepine (Tegretol) Chlordiazepoxide (Librium) Cholestyramine (Questran) Griseofulvin Oral  Contraceptives Rifampin Sucralfate (Carafate) Vitamin K   Coumadin (Warfarin) Major Herbal Interactions  Increased Warfarin Effect Decreased Warfarin Effect  Garlic Ginseng Ginkgo biloba Coenzyme Q10 Green tea St. Johns wort    Coumadin (Warfarin) FOOD Interactions  Eat a consistent number of servings per week of foods HIGH in Vitamin K (1 serving =  cup)  Collards (cooked, or boiled & drained) Kale (cooked, or boiled & drained) Mustard greens (cooked, or boiled & drained) Parsley *serving size only =  cup Spinach (cooked, or boiled & drained) Swiss chard (cooked, or boiled & drained) Turnip greens (cooked, or boiled & drained)  Eat a consistent number of servings per week of foods MEDIUM-HIGH in Vitamin K (1 serving = 1 cup)  Asparagus (cooked, or boiled & drained) Broccoli (cooked, boiled & drained, or raw & chopped) Brussel sprouts (cooked, or boiled & drained) *serving size only =  cup Lettuce, raw (green leaf, endive, romaine) Spinach, raw Turnip greens, raw & chopped   These websites have more information on Coumadin (warfarin):  http://www.king-russell.com/; https://www.hines.net/;

## 2015-11-15 NOTE — ED Notes (Addendum)
Pt reports that he "can't catch my breath...I have gotten a little light headed".  He had surgery on his knee on December 12.  No fever, no nausea/vomiting.  He has a little headache.

## 2015-11-15 NOTE — ED Provider Notes (Signed)
CSN: [id number]     Arrival date & time 11/15/15  0234 History   By signing my name below, I, Gonzella Lex, attest that this documentation has been prepared under the direction and in the presence of Marily Memos, MD. Electronically Signed: Gonzella Lex, Scribe. 11/15/2015. 3:56 AM.    Chief Complaint  Patient presents with  . Shortness of Breath    The history is provided by the patient and a parent. No language interpreter was used.    HPI Comments: Joe Deleon is a 29 y.o. male who presents to the Emergency Department complaining of sudden onset of SOB onset three hours ago with associated dizziness which lasted about thirty minutes. Pt also notes onset of a productive cough with yellow sputum yesterday. He reports that he was in the ED four days ago because he was spitting up blood. Pt's mother reports a urine sample was taken to check for blood in his kidneys. Pt recently had a 3 hour surgery on his left knee for a torn meniscus and torn cartilage. His mother reports that he experienced swelling following the surgery which has since resolved. Pt denies fever and abdominal pain. He also denies a hx of blood clots and recent travel .   Past Medical History  Diagnosis Date  . Cardiomyopathy [facility])     released by cardiologist in 2007. Dr. Elizebeth Brooking  . Umbilical hernia   . Complication of anesthesia   . PONV (postoperative nausea and vomiting)   . Family history of adverse reaction to anesthesia     MOTHER HAS NAUSEA   . PE (pulmonary embolism) 10/2015   Past Surgical History  Procedure Laterality Date  . Wisdom tooth extraction    . Knee surgery Left    Family History  Problem Relation Age of Onset  . Cancer Maternal Grandmother     bladder   Social History  Substance Use Topics  . Smoking status: Never Smoker   . Smokeless tobacco: Never Used  . Alcohol Use: No    Review of Systems  Constitutional: Negative for fever.  HENT:       Blood in sputum   Respiratory: Positive for cough and shortness of breath.   Gastrointestinal: Negative for abdominal pain.  Neurological: Positive for dizziness.  All other systems reviewed and are negative.   Allergies  Review of patient's allergies indicates no known allergies.  Home Medications   Prior to Admission medications   Medication Sig Start Date End Date Taking? Authorizing Provider  methocarbamol (ROBAXIN) 500 MG tablet Take 500 mg by mouth every 6 (six) hours as needed for muscle spasms.    Yes Historical Provider, MD  oxyCODONE-acetaminophen (PERCOCET/ROXICET) 5-325 MG tablet Take 1 tablet by mouth every 6 (six) hours as needed for moderate pain.  05/28/14  Yes Historical Provider, MD   BP 118/82 mmHg  Pulse 110  Temp(Src) 98.2 F (36.8 C) (Oral)  Resp 18  Ht  (1.854 m)  Wt 374 lb 12.5 oz (170 kg)  BMI 49.46 kg/m2  SpO2 93% Physical Exam  Constitutional: He is oriented to person, place, and time. He appears well-developed and well-nourished. No distress.  HENT:  Head: Normocephalic.  Eyes: Conjunctivae are normal.  Cardiovascular: Normal rate.   Abdominal: He exhibits no distension.  Neurological: He is alert and oriented to person, place, and time.  Skin: Skin is warm and dry.  Psychiatric: He has a normal mood and affect.  Nursing note and vitals reviewed.  ED Course  Procedures  DIAGNOSTIC STUDIES:    Oxygen Saturation is 96% on RA, adequate by my interpretation.   COORDINATION OF CARE:  3:37 AM Will review labs. Will order chest xray. Discussed treatment plan with pt at bedside and pt agreed to plan.   Labs Review Labs Reviewed  CBC WITH DIFFERENTIAL/PLATELET - Abnormal; Notable for the following:    WBC 10.6 (*)    Platelets 146 (*)    All other components within normal limits  COMPREHENSIVE METABOLIC PANEL - Abnormal; Notable for the following:    Glucose, Bld 116 (*)    Albumin 2.9 (*)    All other components within normal limits  D-DIMER,  QUANTITATIVE (NOT AT [facility]) - Abnormal; Notable for the following:    D-Dimer, Quant 2.32 (*)    All other components within normal limits  CBC - Abnormal; Notable for the following:    WBC 11.1 (*)    All other components within normal limits  PROTIME-INR  HIV ANTIBODY (ROUG[facility] developBarrett [facility]le[facility] H16/59[place]IndMargaMarland Kitchenri >pacification is moderate. No large central emboli are seen. There is high suspicion of a small embolus within a right lower lobe pulmonary arterial branch. Because of the moderate opacification, this is not established with absolute certainty, but I think is probably present. The lungs are clear except for mild patchy density at the right base. No evidence of right heart strain. No mediastinal or hilar mass or lymphadenopathy.  No aortic pathology. Scans in the upper abdomen are unremarkable. Review of the MIP images confirms the above findings. IMPRESSION: Moderate to high level of suspicion for pulmonary emboli in the right lower lobe pulmonary arterial branches. Clot burden is not large. Certainty is not 100% based on only moderate contrast opacification. However, I think the diagnosis is likely. Mild patchy atelectasis at the right base laterally. Electronically Signed   By: Mark  Shogry M.D.   On: 11/15/2015 07:00   I have personally reviewed and evaluated these images and lab results as part of my medical decision-making.   EKG Interpretation   Date/Time:  Monday November 15 2015 02:49:37 EST Ventricular Rate:  100 PR Interval:  143 QRS Duration: 85 QT Interval:  330 QTC Calculation: 426 R Axis:   81 Text Interpretation:  Sinus tachycardia Biatrial enlargement Borderline  repolarization abnormality Confirmed by Taleeya Blondin MD, Landrie Beale ([id number]) on  11/15/2015 5:05:38 AM      MDM   Final diagnoses:  Other acute pulmonary embolism without  acute cor pulmonale (HCC)    29 yo M with multiple R sided lower lobe PE's, likely 2/2 recent surgery and immobilization. lovenox given, medicine consulted and will admit for further management.   I personally performed the services described in this documentation, which was scribed in my presence. The recorded information has been reviewed and is accurate.    Marily Memos, MD 11/16/15 [id number]

## 2015-11-16 DIAGNOSIS — I2699 Other pulmonary embolism without acute cor pulmonale: Secondary | ICD-10-CM | POA: Diagnosis not present

## 2015-11-16 LAB — CBC
HCT: 48.5 % (ref 39.0–52.0)
Hemoglobin: 14.9 g/dL (ref 13.0–17.0)
MCH: 26.2 pg (ref 26.0–34.0)
MCHC: 30.7 g/dL (ref 30.0–36.0)
MCV: 85.4 fL (ref 78.0–100.0)
PLATELETS: 159 10*3/uL (ref 150–400)
RBC: 5.68 MIL/uL (ref 4.22–5.81)
RDW: 15 % (ref 11.5–15.5)
WBC: 11.1 10*3/uL — AB (ref 4.0–10.5)

## 2015-11-16 LAB — PROTIME-INR
INR: 1.15 (ref 0.00–1.49)
Prothrombin Time: 14.9 seconds (ref 11.6–15.2)

## 2015-11-16 LAB — HIV ANTIBODY (ROUTINE TESTING W REFLEX): HIV Screen 4th Generation wRfx: NONREACTIVE

## 2015-11-16 MED ORDER — ENOXAPARIN SODIUM 150 MG/ML ~~LOC~~ SOLN
1.0000 mg/kg | Freq: Two times a day (BID) | SUBCUTANEOUS | Status: DC
  Administered 2015-11-16: 170 mg via SUBCUTANEOUS
  Filled 2015-11-16 (×5): qty 1.13

## 2015-11-16 MED ORDER — GUAIFENESIN-DM 100-10 MG/5ML PO SYRP: 5.0000 mL | ORAL_SOLUTION | ORAL | Status: DC | PRN

## 2015-11-16 MED ORDER — WARFARIN SODIUM 5 MG PO TABS: 5.0000 mg | ORAL_TABLET | Freq: Every day | ORAL | Status: DC

## 2015-11-16 MED ORDER — ENOXAPARIN SODIUM 300 MG/3ML IJ SOLN: 1.0000 mg/kg | Freq: Two times a day (BID) | INTRAMUSCULAR | Status: DC

## 2015-11-16 NOTE — Discharge Summary (Signed)
Name: Joe Deleon MRN: [id number] DOB: 1986/02/02 29 y.o. PCP: Verlon Au, MD  Date of Admission: 11/15/2015  2:41 AM Date of Discharge: 11/16/2015 Attending Physician: Levert Feinstein, MD  Discharge Diagnosis: 1. Acute Pulmonary Embolism   Principal Problem:   Pulmonary embolus (HCC) Active Problems:   Cardiomyopathy (HCC)   Morbidly obese (HCC)  Discharge Medications:   Medication List    TAKE these medications        enoxaparin 300 MG/3ML Soln injection  Commonly known as:  LOVENOX  Inject 1.7 mLs (170 mg total) into the skin every 12 (twelve) hours.     guaiFENesin-dextromethorphan 100-10 MG/5ML syrup  Commonly known as:  ROBITUSSIN DM  Take 5 mLs by mouth every 4 (four) hours as needed for cough.     methocarbamol 500 MG tablet  Commonly known as:  ROBAXIN  Take 500 mg by mouth every 6 (six) hours as needed for muscle spasms.     oxyCODONE-acetaminophen 5-325 MG tablet  Commonly known as:  PERCOCET/ROXICET  Take 1 tablet by mouth every 6 (six) hours as needed for moderate pain.     warfarin 5 MG tablet  Commonly known as:  COUMADIN  Take 1 tablet (5 mg total) by mouth daily at 6 PM.        Disposition and follow-up:   Joe Deleon was discharged from [facility] in Stable condition.  At the hospital follow up visit please address:  1.  INR and warfarin dosing, Lovenox bridge, continued SOB  2.  Labs / imaging needed at time of follow-up: INR  3.  Pending labs/ test needing follow-up: none  Follow-up Appointments:     Follow-up Information    Follow up with Iona Hansen, NP On 11/19/2015.   Specialty:  Nurse Practitioner   Why:  11:10AM   Contact information:   [address] [place] [address] [phone]       Discharge Instructions: Discharge Instructions    Call MD for:  difficulty breathing, headache or visual disturbances    Complete by:  As directed      Call MD for:  persistant dizziness or light-headedness    Complete by:  As directed      Diet - low sodium heart healthy    Complete by:  As directed      Increase activity slowly    Complete by:  As directed            Consultations:    Procedures Performed:  Dg Chest 2 View  11/15/2015  CLINICAL DATA:  Initial evaluation for shortness of breath. EXAM: CHEST  2 VIEW COMPARISON:  Prior study from 11/10/2015. FINDINGS: Cardiomegaly is stable. Mediastinal silhouette within normal limits. Lungs are mildly hypoinflated. Mild pulmonary vascular congestion without overt pulmonary edema. No consolidative airspace disease. No pleural effusion. No pneumothorax. No acute osseus abnormality. IMPRESSION: 1. Stable cardiomegaly with mild perihilar vascular congestion without overt pulmonary edema. 2. No other active cardiopulmonary disease. Electronically Signed   By: Rise Mu M.D.   On: 11/15/2015 04:17   Dg Chest 2 View  11/10/2015  CLINICAL DATA:  Hemoptysis. EXAM: CHEST  2 VIEW COMPARISON:  December 07, 2013. FINDINGS: Stable cardiomegaly. No pneumothorax or pleural effusion is noted. No acute pulmonary disease is noted. Bony thorax is unremarkable. IMPRESSION: No active cardiopulmonary disease. Electronically Signed   By: Lupita Raider, M.D.   On: 11/10/2015 13:34   Ct Angio  Chest Pe W/cm &/or Wo Cm  11/15/2015  CLINICAL DATA:  Recent orthopedic surgery. Acute onset of shortness of breath. Elevated D-dimer. EXAM: CT ANGIOGRAPHY CHEST WITH CONTRAST TECHNIQUE: Multidetector CT imaging of the chest was performed using the standard protocol during bolus administration of intravenous contrast. Multiplanar CT image reconstructions and MIPs were obtained to evaluate the vascular anatomy. CONTRAST:  OMNIPAQUE IOHEXOL 350 MG/ML SOLN COMPARISON:  Radiography same day FINDINGS: Pulmonary arterial opacification is moderate. No large central emboli are seen. There is high suspicion of a small  embolus within a right lower lobe pulmonary arterial branch. Because of the moderate opacification, this is not established with absolute certainty, but I think is probably present. The lungs are clear except for mild patchy density at the right base. No evidence of right heart strain. No mediastinal or hilar mass or lymphadenopathy. No aortic pathology. Scans in the upper abdomen are unremarkable. Review of the MIP images confirms the above findings. IMPRESSION: Moderate to high level of suspicion for pulmonary emboli in the right lower lobe pulmonary arterial branches. Clot burden is not large. Certainty is not 100% based on only moderate contrast opacification. However, I think the diagnosis is likely. Mild patchy atelectasis at the right base laterally. Electronically Signed   By: Paulina Fusi M.D.   On: 11/15/2015 07:00    2D Echo:   Cardiac Cath:   Admission HPI: Joe Deleon is a very pleasant 29 y.o. male with a PMH of cardiomyopathy (saw Dr. Ross/cardiology on 10/25/15 and was cleared for knee surgery), morbidly obese, umbilical hernia, meniscal tear, who presents with SOB. He was in the ED on 12/14 for mild hemoptysis and denied SOB at that time. Pt had meniscal surgery (Dr. Eulah Pont) on left knee on 12/12. States he has not been up moving around a lot since his surgery.   Today, around 12:30AM he developed sudden SOB. Denies any CP, hemoptysis, LE swelling. Has not experienced much pain in his knee since the surgery. Denies any calf pain or LE swelling. His only medications were methocarbamol and percocet. He has been doing some knee exercises prescribed by Dr. Eulah Pont and is scheduled to have a post-op follow up appointment with him tomorrow around 11AM. No personal history of previous blood clots. Mother states she had a blood clot previously and was on coumadin. He recently established with a PCP, Dr. Leavy Cella in [facility].   In the ED, d-dimer was elevated at 2.32 with CTA chest  showing moderate to high level of suspeicion for PE in the RLLL but not 100% certainty. No evidence of right heart strain. EKG without acute changes. VSS. He was given a dose of lovenox in the ED.   Hospital Course by problem list: Principal Problem:   Pulmonary embolus (HCC) Active Problems:   Cardiomyopathy (HCC)   Morbidly obese (HCC)   Acute, Uncomplicated, Provoked Pulmonary Embolism: Patient's O2 sats were maintained on RA. Due to patient's morbid obesity, he was not a candidate for DOAC treatment of his PE.  He was started on Lovenox 1 mg/kg for bridge to therapeutic INR on warfarin.  He received one dose of Warfarin 7.5 mg and was discharged on Warfarin 5 mg daily.  Due to this being a provoked PE after orthopedic surgery, the patient will only need to remain on anticoagulation for 3 months.  At follow up, please check patient's INR and adjust his warfarin dosing appropriately.   Discharge Vitals:   BP 118/82 mmHg  Pulse 110  Temp(Src) 98.2 F (36.8 C) (Oral)  Resp 18  Ht 6\' 1"  (1.854 m)  Wt 374 lb 12.5 oz (170 kg)  BMI 49.46 kg/m2  SpO2 93%  Discharge Labs:  Results for orders placed or performed during the hospital encounter of 11/15/15 (from the past 24 hour(s))  CBC     Status: Abnormal   Collection Time: 11/16/15  2:32 AM  Result Value Ref Range   WBC 11.1 (H) 4.0 - 10.5 K/uL   RBC 5.68 4.22 - 5.81 MIL/uL   Hemoglobin 14.9 13.0 - 17.0 g/dL   HCT 09.8 11.9 - 14.7 %   MCV 85.4 78.0 - 100.0 fL   MCH 26.2 26.0 - 34.0 pg   MCHC 30.7 30.0 - 36.0 g/dL   RDW 82.9 56.2 - 13.0 %   Platelets 159 150 - 400 K/uL  Protime-INR     Status: None   Collection Time: 11/16/15  2:32 AM  Result Value Ref Range   Prothrombin Time 14.9 11.6 - 15.2 seconds   INR 1.15 0.00 - 1.49    Signed: Jana Half, MD 11/16/2015, 11:52 AM    Services Ordered on Discharge: none Equipment Ordered on Discharge: none

## 2015-11-16 NOTE — Progress Notes (Signed)
Subjective: Joe Deleon.  He feels better this morning though he continues to have cough.  He feels good enough to go home.  He understands that he will need to follow up to have his INR checked.  Objective: Vital signs in last 24 hours: Filed Vitals:   11/15/15 1343 11/15/15 1645 11/15/15 2155 11/16/15 0409  BP: 150/88  114/78 118/82  Pulse: 114  109 110  Temp: 98.1 F (36.7 C)  98.1 F (36.7 C) 98.2 F (36.8 C)  TempSrc: Oral  Oral Oral  Resp: Height:      Weight:  374 lb 12.5 oz (170 kg)    SpO2: 96%  94% 93%   Weight change: -1 lb 11.1 oz (-0.767 kg)  Intake/Output Summary (Last 24 hours) at 11/16/15 1126 Last data filed at 11/16/15 0800  Gross per 24 hour  Intake    960 ml  Output      0 ml  Net    960 ml   Physical Exam  Constitutional: He is oriented to person, place, and time and well-developed, well-nourished, and in no distress.  Morbidly obese male, sitting in chair.   HENT:  Head: Normocephalic and atraumatic.  Eyes: EOM are normal. No scleral icterus.  Neck: No tracheal deviation present.  Cardiovascular: Regular rhythm, normal heart sounds and intact distal pulses.   Tachycardic.  Pulmonary/Chest: Effort normal and breath sounds normal. No respiratory distress. He has no wheezes.  Abdominal: Soft. He exhibits no distension. There is no tenderness. There is no rebound and no guarding.  Large abdomen.  Musculoskeletal: He exhibits no edema or tenderness.  Left leg wrapped and immobilized.  Dressing not taken down for inspection, but calf nontender.  Neurological: He is alert and oriented to person, place, and time.  Skin: Skin is warm and dry. No rash noted.    Lab Results: Basic Metabolic Panel:  Recent Labs Lab 11/15/15 0348  NA 137  K 4.4  CL 102  CO2 28  GLUCOSE 116*  BUN 8  CREATININE 1.12  CALCIUM 9.0   Liver Function Tests:  Recent Labs Lab 11/15/15 0348  AST 25  ALT 27  ALKPHOS 74  BILITOT 0.6  PROT 7.5  ALBUMIN 2.9*     No results for input(s): LIPASE, AMYLASE in the last 168 hours. No results for input(s): AMMONIA in the last 168 hours. CBC:  Recent Labs Lab 11/15/15 0348 11/16/15 0232  WBC 10.6* 11.1*  NEUTROABS 7.1  --   HGB 14.9 14.9  HCT 47.8 48.5  MCV 85.1 85.4  PLT 146* 159   Cardiac Enzymes: No results for input(s): CKTOTAL, CKMB, CKMBINDEX, TROPONINI in the last 168 hours. BNP: No results for input(s): PROBNP in the last 168 hours. D-Dimer:  Recent Labs Lab 11/15/15 0348  DDIMER 2.32*   CBG: No results for input(s): GLUCAP in the last 168 hours. Hemoglobin A1C: No results for input(s): HGBA1C in the last 168 hours. Fasting Lipid Panel: No results for input(s): CHOL, HDL, LDLCALC, TRIG, CHOLHDL, LDLDIRECT in the last 168 hours. Thyroid Function Tests: No results for input(s): TSH, T4TOTAL, FREET4, T3FREE, THYROIDAB in the last 168 hours. Coagulation:  Recent Labs Lab 11/16/15 0232  LABPROT 14.9  INR 1.15   Anemia Panel: No results for input(s): VITAMINB12, FOLATE, FERRITIN, TIBC, IRON, RETICCTPCT in the last 168 hours. Urine Drug Screen: Drugs of Abuse  No results found for: LABOPIA, COCAINSCRNUR, LABBENZ, AMPHETMU, THCU, LABBARB  Alcohol Level: No results for input(s):  ETH in the last 168 hours. Urinalysis: No results for input(s): COLORURINE, LABSPEC, PHURINE, GLUCOSEU, HGBUR, BILIRUBINUR, KETONESUR, PROTEINUR, UROBILINOGEN, NITRITE, LEUKOCYTESUR in the last 168 hours.  Invalid input(s): APPERANCEUR Misc. Labs:   Micro Results: No results found for this or any previous visit (from the past 240 hour(s)). Studies/Results: Dg Chest 2 View  11/15/2015  CLINICAL DATA:  Initial evaluation for shortness of breath. EXAM: CHEST  2 VIEW COMPARISON:  Prior study from 11/10/2015. FINDINGS: Cardiomegaly is stable. Mediastinal silhouette within normal limits. Lungs are mildly hypoinflated. Mild pulmonary vascular congestion without overt pulmonary edema. No consolidative  airspace disease. No pleural effusion. No pneumothorax. No acute osseus abnormality. IMPRESSION: 1. Stable cardiomegaly with mild perihilar vascular congestion without overt pulmonary edema. 2. No other active cardiopulmonary disease. Electronically Signed   By: Rise MuBenjamin  McClintock M.D.   On: 11/15/2015 04:17   Ct Angio Chest Pe W/cm &/or Wo Cm  11/15/2015  CLINICAL DATA:  Recent orthopedic surgery. Acute onset of shortness of breath. Elevated D-dimer. EXAM: CT ANGIOGRAPHY CHEST WITH CONTRAST TECHNIQUE: Multidetector CT imaging of the chest was performed using the standard protocol during bolus administration of intravenous contrast. Multiplanar CT image reconstructions and MIPs were obtained to evaluate the vascular anatomy. CONTRAST:  100mL OMNIPAQUE IOHEXOL 350 MG/ML SOLN COMPARISON:  Radiography same day FINDINGS: Pulmonary arterial opacification is moderate. No large central emboli are seen. There is high suspicion of a small embolus within a right lower lobe pulmonary arterial branch. Because of the moderate opacification, this is not established with absolute certainty, but I think is probably present. The lungs are clear except for mild patchy density at the right base. No evidence of right heart strain. No mediastinal or hilar mass or lymphadenopathy. No aortic pathology. Scans in the upper abdomen are unremarkable. Review of the MIP images confirms the above findings. IMPRESSION: Moderate to high level of suspicion for pulmonary emboli in the right lower lobe pulmonary arterial branches. Clot burden is not large. Certainty is not 100% based on only moderate contrast opacification. However, I think the diagnosis is likely. Mild patchy atelectasis at the right base laterally. Electronically Signed   By: Paulina FusiMark  Shogry M.D.   On: 11/15/2015 07:00   Medications: I have reviewed the patient's current medications. Scheduled Meds: . enoxaparin (LOVENOX) injection  1 mg/kg Subcutaneous Q12H  . warfarin  5  mg Oral q1800  . Warfarin - Physician Dosing Inpatient   Does not apply q1800   Continuous Infusions:  PRN Meds:.guaiFENesin-dextromethorphan, methocarbamol, senna-docusate Assessment/Plan: Principal Problem:   Pulmonary embolus (HCC) Active Problems:   Cardiomyopathy (HCC)   Morbidly obese (HCC)  Acute, Uncomplicated, Provoked Pulmonary Embolism: Pt p/w SOB, previous hemoptysis, mild tachycardia with elevated d-dimer. Recent knee surgery on 12/12. CTA chest: moderate to high level of suspicion for pulmonary emboli in the right lower lobe pulmonary arterial branches. Clot burden is not large. Certainty is not 100% based on only moderate contrast opacification. However, I think the diagnosis is likely. Mild patchy atelectasis at the right base laterally. No evidence of right heart strain. Recent echo on 12/7 with LVEF: 60-65% with grade 1 DD. Due to his morbid obesity, will use warfarin for anticoagulation for 3 months total in the setting of provoked PE.  Will bridge with Lovenox 1 mg/kg until INR therapeutic.  - Warfarin 5 mg daily - Lovenox 1 mg/kg until INR therapeutic - Follow up with PCP office on Friday (12/23) to have INR checked.   Cardiomyopathy Seen by Dr. Tenny Crawoss on  11/28 and cleared for surgery. Echo 12/7 with normal EF and grade 1 DD.  -f/u with Dr. Tenny Craw after holidays   ?Obstructive sleep apnea  -sleep study as outpatient   FEN Fluids-None, tolerating diet  Electrolytes-Normal, replete PRN  Nutrition-HH/Carb Mod  VTE prophylaxis: Lovenox   Disposition Anticipated discharge today or tomorrow.  -consult care management -consult social work  Dispo: Disposition is deferred at this time, awaiting improvement of current medical problems.  Anticipated discharge in approximately 1 day(s).   The patient does have a current PCP (Tammy Eartha Inch, MD) and does not need an [facility] hospital follow-up appointment after discharge.  The patient does not have  transportation limitations that hinder transportation to clinic appointments.  .Services Needed at time of discharge: Y = Yes, Blank = No PT:   OT:   RN:   Equipment:   Other:     LOS: 1 day   Jana Half, MD 11/16/2015, 11:26 AM

## 2015-11-16 NOTE — Care Management Note (Signed)
Case Management Note Donn PieriniKristi Mitsugi Schrader RN, BSN Unit 2W-Case Manager [phone]  Patient Details  Name: Joe BargesBrandon Ramirez MRN: [id number] Date of Birth: 12/17/1985  Subjective/Objective:   Pt admitted with PE    Hx of recent knee surgery             Action/Plan: PTA pt lived at home- anticipate return home-referral for Lovenox at discharge- insurance check completed - drug covered by insurance with no pre-auth- pt will need vials of drug not pre-filled syringes- dose 170 mg (vial is 300mg /113ml) call made to multiple pharmacies but none have drug in stock- [facility] which can order drug to be in stock tomorrow am- pt/mom ok with going there in am to pick drug up, pt can be given dose for this evening to cover till he picks drug up in am. Have spoken with MD who [facility]   Expected Discharge Date:    11/16/15              Expected Discharge Plan:  Home/Self Care  In-House Referral:     Discharge planning Services  CM Consult, Medication Assistance  Post Acute Care Choice:    Choice offered to:     DME Arranged:    DME Agency:     HH Arranged:    HH Agency:     Status of Service:  Completed, signed off  Medicare Important Message Given:    Date Medicare IM Given:    Medicare IM give by:    Date Additional Medicare IM Given:    Additional Medicare Important Message give by:     If discussed at Long Length of Stay Meetings, dates discussed:    Additional Comments:  Darrold SpanWebster, Lorenza Shakir Hall, RN 11/16/2015, 2:00 PM

## 2015-11-19 DIAGNOSIS — Z5181 Encounter for therapeutic drug level monitoring: Secondary | ICD-10-CM | POA: Insufficient documentation

## 2015-12-05 ENCOUNTER — Telehealth: Payer: Self-pay

## 2015-12-05 NOTE — Telephone Encounter (Signed)
Left message to call us back due to office being closed on 12/06/15 due to weather. Left call back number.

## 2015-12-06 ENCOUNTER — Institutional Professional Consult (permissible substitution): Payer: Self-pay | Admitting: Neurology

## 2015-12-08 ENCOUNTER — Other Ambulatory Visit: Payer: Self-pay | Admitting: Internal Medicine

## 2015-12-10 ENCOUNTER — Other Ambulatory Visit: Payer: Self-pay | Admitting: Internal Medicine

## 2015-12-10 ENCOUNTER — Ambulatory Visit (INDEPENDENT_AMBULATORY_CARE_PROVIDER_SITE_OTHER): Payer: BC Managed Care – PPO | Admitting: Neurology

## 2015-12-10 ENCOUNTER — Encounter: Payer: Self-pay | Admitting: Neurology

## 2015-12-10 VITALS — BP 124/76 | HR 84 | Resp 18 | Ht 74.0 in | Wt 365.0 lb

## 2015-12-10 DIAGNOSIS — I2699 Other pulmonary embolism without acute cor pulmonale: Secondary | ICD-10-CM | POA: Diagnosis not present

## 2015-12-10 DIAGNOSIS — R519 Headache, unspecified: Secondary | ICD-10-CM

## 2015-12-10 DIAGNOSIS — R51 Headache: Secondary | ICD-10-CM

## 2015-12-10 DIAGNOSIS — R351 Nocturia: Secondary | ICD-10-CM

## 2015-12-10 DIAGNOSIS — G4733 Obstructive sleep apnea (adult) (pediatric): Secondary | ICD-10-CM

## 2015-12-10 NOTE — Patient Instructions (Signed)

## 2015-12-10 NOTE — Progress Notes (Signed)
Subjective:    Patient ID: Joe Deleon is a 30 y.o. male.  HPI     [name], MD, PhD [facility] [address] [address] [place], [place] [address]  Dear Dr. Leavy Cella,   I saw your patient, Joe Deleon, upon your kind request in my neurologic clinic today for initial consultation of his sleep disorder, in particular, concern for underlying obstructive sleep apnea. The patient is accompanied by his mother today. As you know, Joe Deleon is a 30 year old right-handed gentleman with an underlying medical history of morbid obesity, cardiomyopathy diagnosis in the remote past (in high school), PE with admission to the hospital on 11/15/2015 through 11/16/2015 secondary to acute onset of dyspnea and lightheadedness and hemoptysis, also recent status post left knee surgery on 11/08/15, who reports snoring, excessive daytime somnolence, and witnessed breathing pauses while asleep per mother. He also wakes up coughing. He is a mouth breather and wakes up with a dry mouth a lot. He has no recent breathing issues and has been on Coumadin. He has been trying to lose weight and has lost nearly 30 pounds since early December. He had knee surgery on 11/08/2015. He does not drink caffeine regularly and has reduced his caffeine intake. He does not smoke or drink alcohol and denies illicit drug use. He does take the occasional oxycodone for residual knee pain. He does not take it daily. He does not take anything for sleep. His sleep schedule is not set at this time. He says he is a night out. 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 He is currently not working. His rise time for his usual workday is 5 AM. Currently he gets up around 9:30 AM. His bedtime varies between 10 PM and midnight. He lives at home with his mother, his cousin and his maternal aunt. He denies restless leg symptoms. He has nocturia, usually once or twice per night on average. He has  occasional morning headaches which have recently improved. I reviewed your office note from 11/12/2015, which you kindly included.  His Past Medical History Is Significant For: Past Medical History  Diagnosis Date  . Cardiomyopathy [facility])     released by cardiologist in 2007. Dr. Elizebeth Brooking  . Umbilical hernia   . Complication of anesthesia   . PONV (postoperative nausea and vomiting)   . Family history of adverse reaction to anesthesia     MOTHER HAS NAUSEA   . PE (pulmonary embolism) 10/2015  . Heart disease     His Past Surgical History Is Significant For: Past Surgical History  Procedure Laterality Date  . Wisdom tooth extraction    . Knee surgery Left     His Family History Is Significant For: Family History  Problem Relation Age of Onset  . Cancer Maternal Grandmother     bladder  . Diabetes Maternal Grandmother   . Hypertension Father   . Hypertension Maternal Grandfather     His Social History Is Significant For: Social History   Social History  . Marital Status: Single    Spouse Name: N/A  . Number of Children: N/A  . Years of Education: [employer]   Social History Main Topics  . Smoking status: Never Smoker   . Smokeless tobacco: Never Used  . Alcohol Use: No  . Drug Use: No  . Sexual Activity: Not Asked   Other Topics Concern  . None   Social History Narrative   Drinks tea, occasional soda  His Allergies Are:  No Known Allergies:   His Current Medications Are:  Outpatient Encounter Prescriptions as of 12/10/2015  Medication Sig  . enoxaparin (LOVENOX) 300 MG/3ML SOLN injection Inject 1.7 mLs (170 mg total) into the skin every 12 (twelve) hours.  Marland Kitchen oxyCODONE-acetaminophen (PERCOCET/ROXICET) 5-325 MG tablet Take 1 tablet by mouth every 6 (six) hours as needed for moderate pain.   Marland Kitchen warfarin (COUMADIN) 5 MG tablet Take 1 tablet (5 mg total) by mouth daily at 6 PM.  . methocarbamol (ROBAXIN) 500 MG tablet Take 500 mg by mouth every 6 (six) hours as  needed for muscle spasms. Reported on 12/10/2015  . [DISCONTINUED] guaiFENesin-dextromethorphan (ROBITUSSIN DM) 100-10 MG/5ML syrup Take 5 mLs by mouth every 4 (four) hours as needed for cough.   No facility-administered encounter medications on file as of 12/10/2015.  :  Review of Systems:  Out of a complete 14 point review of systems, all are reviewed and negative with the exception of these symptoms as listed below:   Review of Systems  Neurological:       Snoring, witnessed apnea, wakes up with eye redness and headaches, occasionally takes naps.    Epworth Sleepiness Scale 0= would never doze 1= slight chance of dozing 2= moderate chance of dozing 3= high chance of dozing  Sitting and reading:1 Watching TV:0 Sitting inactive in a public place (ex. Theater or meeting):0 As a passenger in a car for an hour without a break:1 Lying down to rest in the afternoon:1 Sitting and talking to someone:0 Sitting quietly after lunch (no alcohol):1 In a car, while stopped in traffic:0 Total:4  Objective:  Neurologic Exam  Physical Exam Physical Examination:   Filed Vitals:   12/10/15 1337  BP: 124/76  Pulse: 84  Resp: 18    General Examination: The patient is a very pleasant 30 y.o. male in no acute distress. He appears well-developed and well-nourished and adequately groomed. He is morbidly obese.   HEENT: Normocephalic, atraumatic, pupils are equal, round and reactive to light and accommodation. Funduscopic exam is normal with sharp disc margins noted. Extraocular tracking is good without limitation to gaze excursion or nystagmus noted. Normal smooth pursuit is noted. Hearing is grossly intact. Tympanic membranes are clear bilaterally. Face is symmetric with normal facial animation and normal facial sensation. Speech is clear with no dysarthria noted. There is no hypophonia. There is no lip, neck/head, jaw or voice tremor. Neck is supple with full range of passive and active motion.  There are no carotid bruits on auscultation. Oropharynx exam reveals: moderate mouth dryness, adequate dental hygiene and marked airway crowding, due to thick and swollen uvula and soft palate and larger/longer tongue and tonsils in place of 1-2+. Mallampati is class II. Tongue protrudes centrally and palate elevates symmetrically. Neck size is 19.5 inches. He has a minimal overbite. Nasal inspection reveals no significant nasal mucosal bogginess or redness and no septal deviation.   Chest: Clear to auscultation without wheezing, rhonchi or crackles noted.  Heart: S1+S2+0, regular and normal without murmurs, rubs or gallops noted.   Abdomen: Soft, non-tender and non-distended with normal bowel sounds appreciated on auscultation.  Extremities: There is no pitting edema in the distal lower extremities bilaterally. Pedal pulses are intact.  Skin: Warm and dry without trophic changes noted. There are no varicose veins.  Musculoskeletal: exam reveals no obvious joint deformities, tenderness or joint swelling or erythema.he has a well-healing scar on the left knee. He does have some decrease in range of  motion on the left.   Neurologically:  Mental status: The patient is awake, alert and oriented in all 4 spheres. His immediate and remote memory, attention, language skills and fund of knowledge are appropriate. There is no evidence of aphasia, agnosia, apraxia or anomia. Speech is clear with normal prosody and enunciation. Thought process is linear. Mood is normal and affect is normal.  Cranial nerves II - XII are as described above under HEENT exam. In addition: shoulder shrug is normal with equal shoulder height noted. Motor exam: Normal bulk, strength and tone is noted. There is no drift, tremor or rebound. Romberg is negative. Reflexes are 1+ throughout. Fine motor skills and coordination: intact with normal finger taps, normal hand movements, normal rapid alternating patting, normal foot taps and  normal foot agility.  Cerebellar testing: No dysmetria or intention tremor on finger to nose testing. Heel to shin is unremarkable bilaterally. There is no truncal or gait ataxia.  Sensory exam: intact to light touch, pinprick, vibration, temperature sense in the upper and lower extremities.  Gait, station and balance: He stands easily. No veering to one side is noted. No leaning to one side is noted. Posture is age-appropriate and stance is narrow based. Gait shows normal stride length and normal pace. No problems turning are noted. He turns en bloc. Tandem walk is not possible for him as he has trouble putting weight on his left leg.                Assessment and Plan:  In summary, Joe Deleon is a very pleasant 30 y.o.-year old male with an underlying medical history of morbid obesity, cardiomyopathy diagnosis in the remote past (in high school), PE with admission to the hospital on 11/15/2015 through 11/16/2015 secondary to acute onset of dyspnea and lightheadedness and hemoptysis, also recent status post left knee surgery on 11/08/15, whose history and physical exam are in keeping with obstructive sleep apnea (OSA). I had a long chat with the patient and his mother about my findings and the diagnosis of OSA, its prognosis and treatment options. We talked about medical treatments, surgical interventions and non-pharmacological approaches. I explained in particular the risks and ramifications of untreated moderate to severe OSA, especially with respect to developing cardiovascular disease down the Road, including congestive heart failure, difficult to treat hypertension, cardiac arrhythmias, or stroke. Even type 2 diabetes has, in part, been linked to untreated OSA. Symptoms of untreated OSA include daytime sleepiness, memory problems, mood irritability and mood disorder such as depression and anxiety, lack of energy, as well as recurrent headaches, especially morning headaches. We talked about trying  to maintain a healthy lifestyle in general, as well as the importance of weight control. I encouraged the patient to eat healthy, exercise daily and keep well hydrated, to keep a scheduled bedtime and wake time routine, to not skip any meals and eat healthy snacks in between meals. I advised the patient not to drive when feeling sleepy. I recommended the following at this time: sleep study with potential positive airway pressure titration. (We will score hypopneas at 3% and split the sleep study into diagnostic and treatment portion, if the estimated. 2 hour AHI is >15/h).   I explained the sleep test procedure to the patient and also outlined possible surgical and non-surgical treatment options of OSA, including the use of a custom-made dental device (which would require a referral to a specialist dentist or oral surgeon), upper airway surgical options, such as pillar implants, radiofrequency surgery,  tongue base surgery, and UPPP (which would involve a referral to an ENT surgeon). Rarely, jaw surgery such as mandibular advancement may be considered.  I also explained the CPAP treatment option to the patient, who indicated that he would be willing to try CPAP if the need arises. I explained the importance of being compliant with PAP treatment, not only for insurance purposes but primarily to improve His symptoms, and for the patient's long term health benefit, including to reduce His cardiovascular risks. I answered all their questions today and the patient and his mother were in agreement. I would like to see him back after the sleep study is completed and encouraged him to call with any interim questions, concerns, problems or updates.   Thank you very much for allowing me to participate in the care of this nice patient. If I can be of any further assistance to you please do not hesitate to call me at [phone].  Sincerely,   Huston FoleySaima Waylynn Benefiel, MD, PhD

## 2015-12-19 ENCOUNTER — Ambulatory Visit (INDEPENDENT_AMBULATORY_CARE_PROVIDER_SITE_OTHER): Payer: BC Managed Care – PPO | Admitting: Neurology

## 2015-12-19 DIAGNOSIS — G4733 Obstructive sleep apnea (adult) (pediatric): Secondary | ICD-10-CM

## 2015-12-19 DIAGNOSIS — G4734 Idiopathic sleep related nonobstructive alveolar hypoventilation: Secondary | ICD-10-CM

## 2015-12-19 DIAGNOSIS — R9431 Abnormal electrocardiogram [ECG] [EKG]: Secondary | ICD-10-CM

## 2015-12-19 NOTE — Sleep Study (Signed)
Please see the scanned sleep study interpretation located in the Procedure tab within the Chart Review section. 

## 2015-12-20 ENCOUNTER — Telehealth: Payer: Self-pay | Admitting: Neurology

## 2015-12-20 DIAGNOSIS — G4734 Idiopathic sleep related nonobstructive alveolar hypoventilation: Secondary | ICD-10-CM

## 2015-12-20 DIAGNOSIS — G4733 Obstructive sleep apnea (adult) (pediatric): Secondary | ICD-10-CM

## 2015-12-20 DIAGNOSIS — R9431 Abnormal electrocardiogram [ECG] [EKG]: Secondary | ICD-10-CM

## 2015-12-20 NOTE — Telephone Encounter (Signed)
I spoke to patient and he is aware of results and recommendations. He is willing to proceed with treatment. I will send orders to [facility]. I will send patient a letter, reminding him to make appt and stress importance of compliance.

## 2015-12-20 NOTE — Telephone Encounter (Signed)
Diana:  Patient referred by Dr. Leavy Cella, seen by me on 12/10/15, split study on 12/19/15, Ins: BCBS. Please call and notify patient that the recent sleep study confirmed the diagnosis of VERY severe OSA. He did very well with CPAP during the study with significant improvement of the respiratory events and O2 sats. Therefore, I would like start the patient on CPAP therapy at home by prescribing a machine for home use. I placed the order in the chart. The patient will need a follow up appointment with me in 8 to 10 weeks post set up that has to be scheduled; please go ahead and schedule while you have the patient on the phone and make sure patient understands the importance of keeping this window for the FU appointment, as it is often an insurance requirement and failing to adhere to this may result in losing coverage for sleep apnea treatment.  Please re-enforce the importance of compliance with treatment and the need for [place] to monitor compliance data - again an insurance requirement and good feedback for the patient as far as how they are doing.  Also remind patient, that any upcoming CPAP machine or mask issues, should be first addressed with the DME company. Please ask if patient has a preference regarding DME company.  Please arrange for CPAP set up at home through a DME company of patient's choice - once you have spoken to the patient - and faxed/routed report to PCP and referring MD (if other than PCP), you can close this encounter, thanks,   [name], MD, PhD [facility] ([facility])

## 2016-01-03 ENCOUNTER — Ambulatory Visit: Payer: Self-pay | Admitting: Internal Medicine

## 2016-01-14 ENCOUNTER — Encounter: Payer: Self-pay | Admitting: Internal Medicine

## 2016-01-14 ENCOUNTER — Ambulatory Visit (INDEPENDENT_AMBULATORY_CARE_PROVIDER_SITE_OTHER): Payer: BC Managed Care – PPO | Admitting: Internal Medicine

## 2016-01-14 VITALS — BP 136/92 | HR 92 | Ht 73.0 in | Wt 363.0 lb

## 2016-01-14 DIAGNOSIS — I10 Essential (primary) hypertension: Secondary | ICD-10-CM | POA: Diagnosis not present

## 2016-01-14 DIAGNOSIS — I2690 Septic pulmonary embolism without acute cor pulmonale: Secondary | ICD-10-CM | POA: Diagnosis not present

## 2016-01-14 DIAGNOSIS — G4733 Obstructive sleep apnea (adult) (pediatric): Secondary | ICD-10-CM | POA: Diagnosis not present

## 2016-01-14 NOTE — Progress Notes (Signed)
Cardiology Office Note   Date:  01/14/2016   ID:  Joe Deleon, DOB 11-Aug-1986, MRN [id number]  PCP:  Verlon Au, MD  Cardiologist:   Dietrich Pates, MD   F/U of HTN      History of Present Illness: Joe Deleon is a 30 y.o. male with a history of HTN   The pt was followed in the past by Dr Elizebeth Brooking [facility]) for possible hypertrophic cardiomyopathy He was not felt to have problems and was discharged from clinic in 2007 The pt is active Plays basketball regularly Denies CP Breathing is OK No dizziness/syncope No palpitations.   I saw the pt in November 2016  Echo ordered  BP was high at time  Set up for sleep study.   Echo showed moderate LVH  He was admitted in December after knew arthroscopy with a PE.  Seen by Richardo Priest  Recomm coumadin given dize.   Sleep study done showing severe sleep apnea    Current Outpatient Prescriptions  Medication Sig Dispense Refill  . oxyCODONE-acetaminophen (PERCOCET/ROXICET) 5-325 MG tablet Take 1 tablet by mouth every 6 (six) hours as needed for moderate pain.     Marland Kitchen warfarin (COUMADIN) 5 MG tablet Take 1 tablet (5 mg total) by mouth daily at 6 PM. 30 tablet 0   No current facility-administered medications for this visit.    Allergies:   Review of patient's allergies indicates no known allergies.   Past Medical History  Diagnosis Date  . Cardiomyopathy [facility])     released by cardiologist in 2007. Dr. Elizebeth Brooking  . Umbilical hernia   . Complication of anesthesia   . PONV (postoperative nausea and vomiting)   . Family history of adverse reaction to anesthesia     MOTHER HAS NAUSEA   . PE (pulmonary embolism) 10/2015  . Heart disease     Past Surgical History  Procedure Laterality Date  . Wisdom tooth extraction    . Knee surgery Left      Social History:  The patient  reports that he has never smoked. He has never used smokeless tobacco. He reports that he does not drink alcohol or use illicit drugs.    Family History:  The patient's family history includes Cancer in his maternal grandmother; Diabetes in his maternal grandmother; Hypertension in his father and maternal grandfather.    ROS:  Please see the history of present illness. All other systems are reviewed and  Negative to the above problem except as noted.    PHYSICAL EXAM: VS:  BP 136/92 mmHg  Pulse 92  Ht  (1.854 m)  Wt 164.656 kg (363 lb)  BMI 47.90 kg/m2  GEN: Morbidly obese 29yo in no acute distress HEENT: normal Neck: no JVD, carotid bruits, or masses Cardiac: RRR; no murmurs, rubs, or gallops,no edema  Respiratory:  clear to auscultation bilaterally, normal work of breathing GI: soft, nontender, nondistended, + BS  No hepatomegaly  MS: no deformity Moving all extremities   Skin: warm and dry, no rash Neuro:  Strength and sensation are intact Psych: euthymic mood, full affect   EKG:  EKG is not ordered today.   Lipid Panel No results found for: CHOL, TRIG, HDL, CHOLHDL, VLDL, LDLCALC, LDLDIRECT    Wt Readings from Last 3 Encounters:  01/14/16 164.656 kg (363 lb)  12/10/15 165.563 kg (365 lb)  11/15/15 170 kg (374 lb 12.5 oz)      ASSESSMENT AND PLAN:  1  HTN  BP  is better  Has lost 30 lbzs   I will f/u in July  2. OSA  Just started CPAP 1 wk ago  Continue  3.  PE  Pt having INR checks at primary MD who will d/c coumadin at about 3 months  4  Obesity  Consciously trying to lose wt with SUCCESS  Continue        Disposition:   FU with me in [place], Dietrich Pates, MD  01/14/2016 9:15 AM    [facility] [address] [place], [place], [place]  [address] Phone: [phone]; Fax: [phone]

## 2016-01-14 NOTE — Patient Instructions (Signed)
Your physician recommends that you continue on your current medications as directed. Please refer to the Current Medication list given to you today. Your physician wants you to follow-up in: 6 months with Dr. Ross.  You will receive a reminder letter in the mail two months in advance. If you don't receive a letter, please call our office to schedule the follow-up appointment.  

## 2016-08-05 IMAGING — CT CT ANGIO CHEST
1 of 8 series · 17 of 36 positions shown · IV contrast (omnipaque)
Comparison: Radiography same day

CLINICAL DATA: Recent orthopedic surgery. Acute onset of shortness
of breath. Elevated D-dimer.

EXAM:
CT ANGIOGRAPHY CHEST WITH CONTRAST
TECHNIQUE: Multidetector CT imaging of the chest was performed using the
standard protocol during bolus administration of intravenous
contrast. Multiplanar CT image reconstructions and MIPs were
obtained to evaluate the vascular anatomy.
CONTRAST:  100mL OMNIPAQUE IOHEXOL 350 MG/ML SOLN

[Series 407: thins pacs · axial · 0.77mm/px · z∈[+14,+230]mm · 17 of 244 slices shown]
[im 14/244  lung]
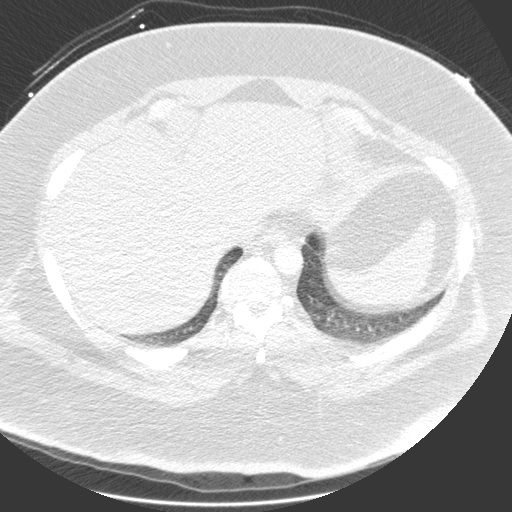
[im 28/244  mediastinal]
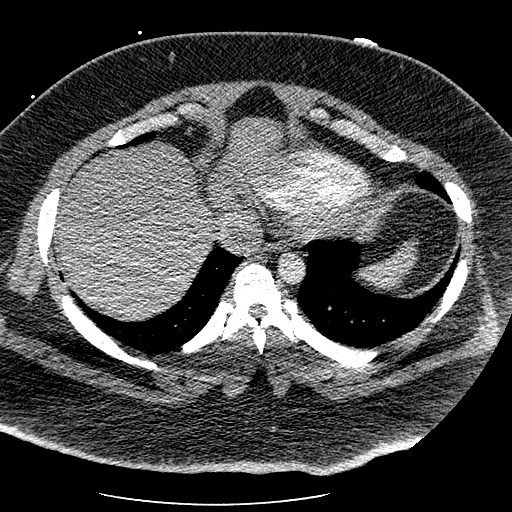
[im 41/244  lung]
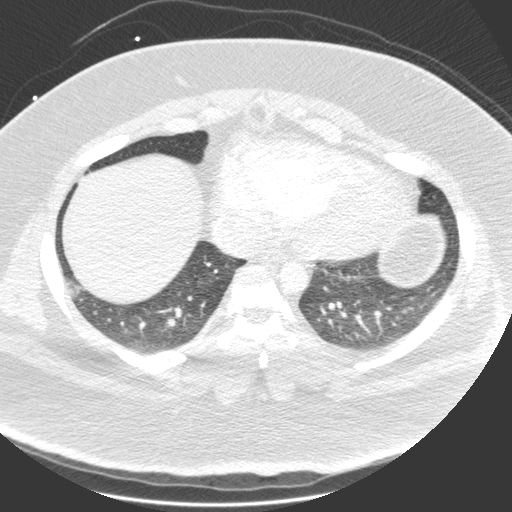
[im 55/244  mediastinal]
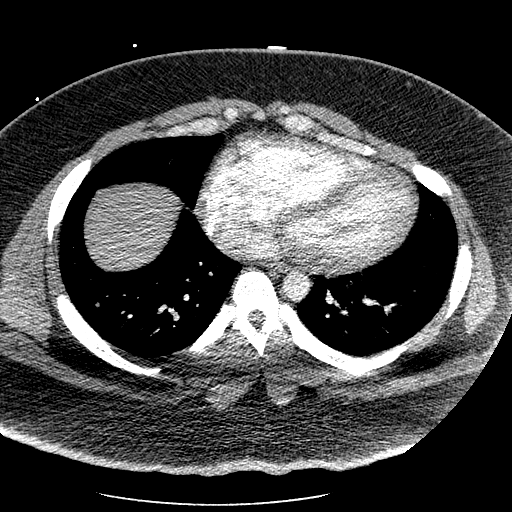
[im 68/244  lung]
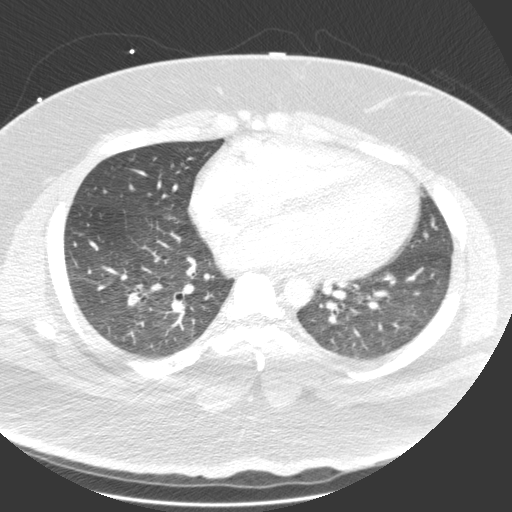
[im 82/244  mediastinal]
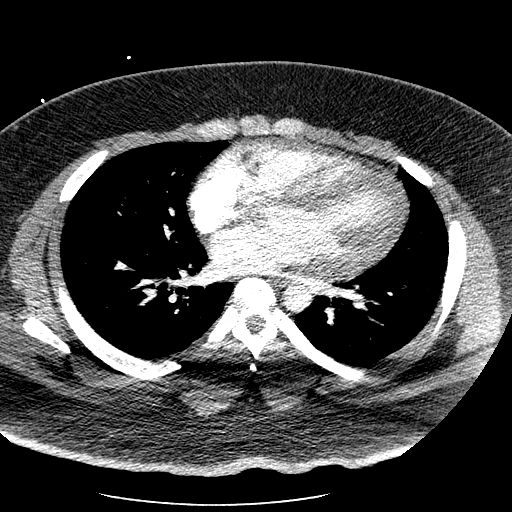
[im 95/244  lung]
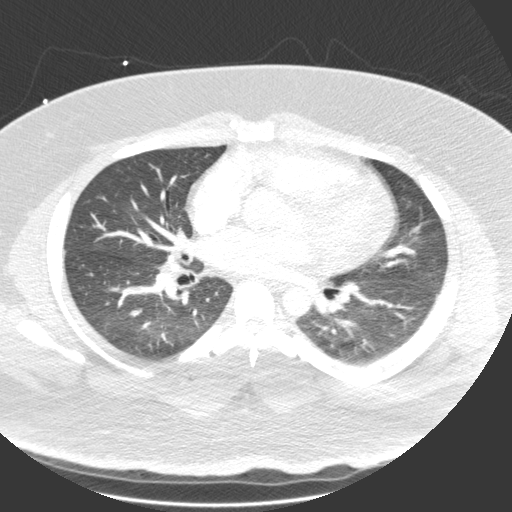
[im 109/244  mediastinal]
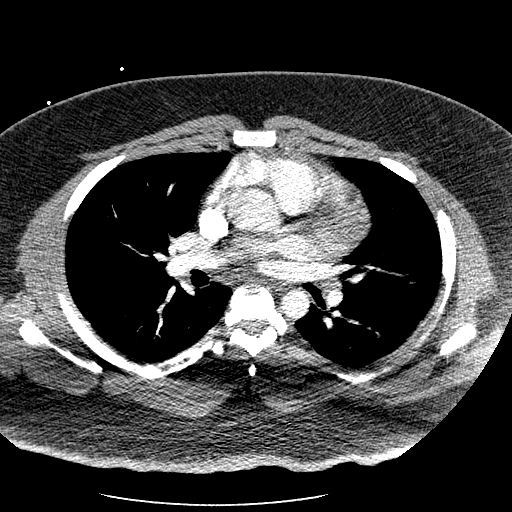
[im 122/244  lung]
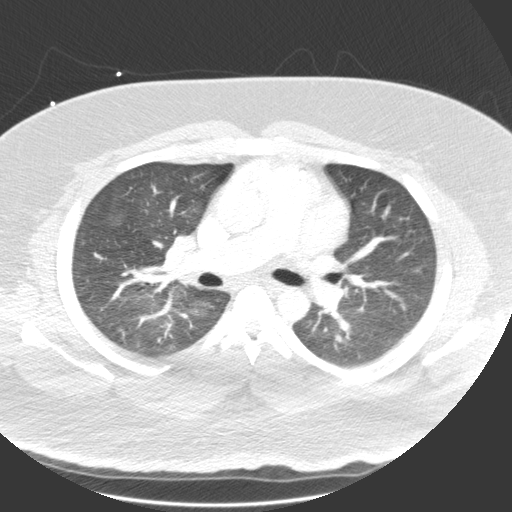
[im 136/244  mediastinal]
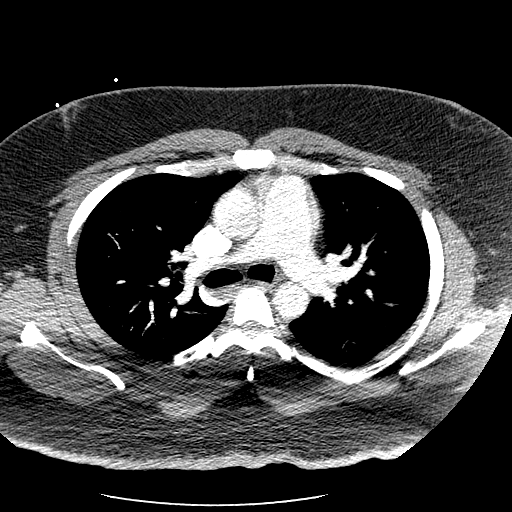
[im 149/244  lung]
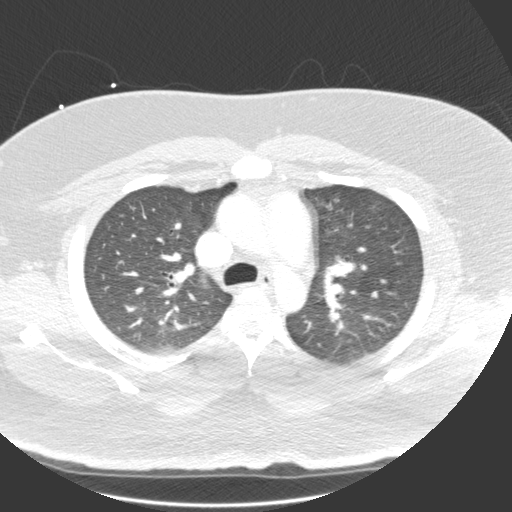
[im 163/244  mediastinal]
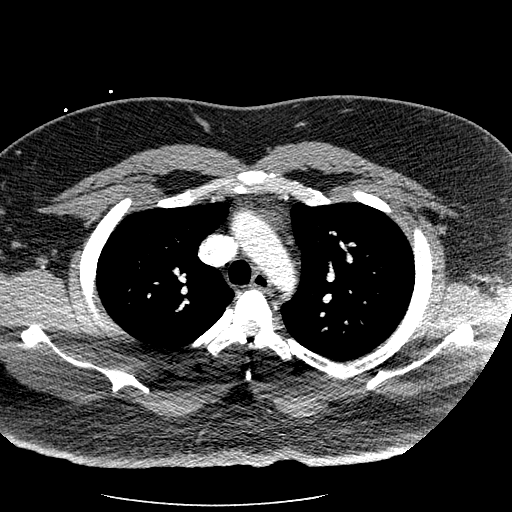
[im 176/244  lung]
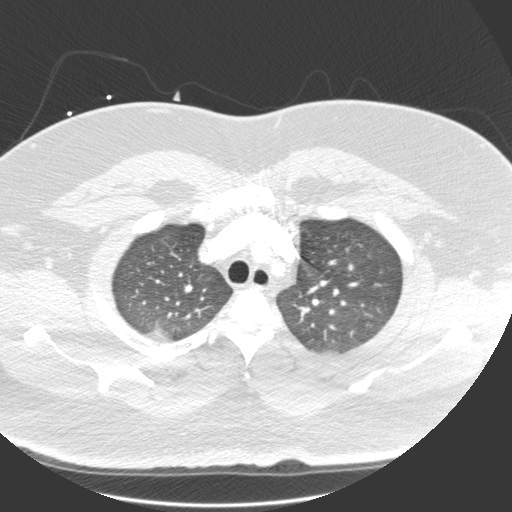
[im 190/244  mediastinal]
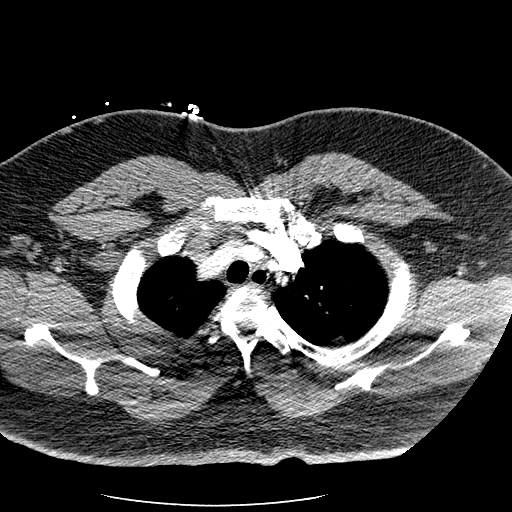
[im 203/244  lung]
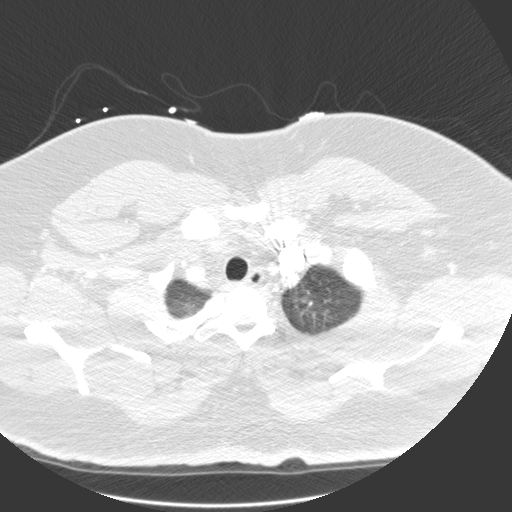
[im 217/244  mediastinal]
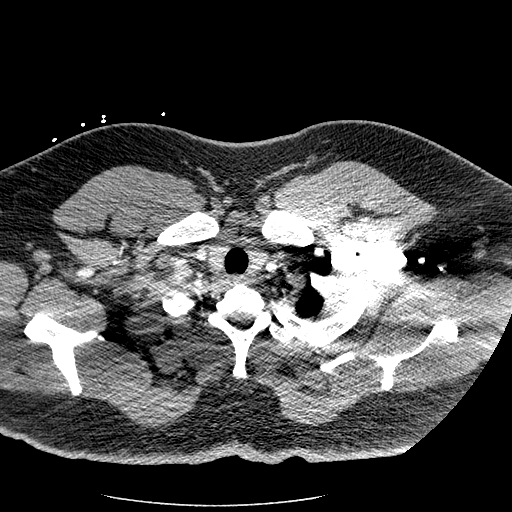
[im 230/244  lung]
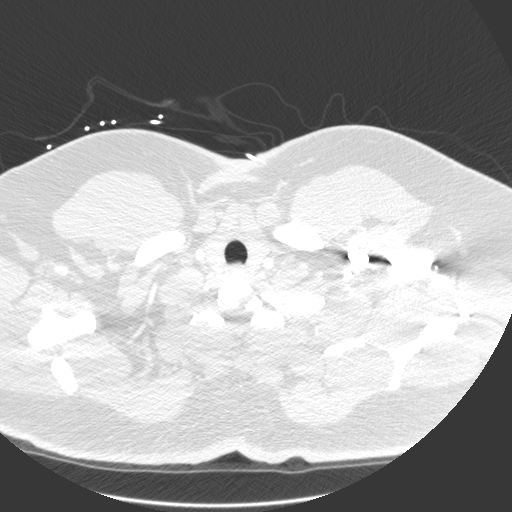

[17 of 36 positions shown; findings below may reference images not displayed]

FINDINGS: Pulmonary arterial opacification is moderate. No large central
emboli are seen. There is high suspicion of a small embolus within a
right lower lobe pulmonary arterial branch. Because of the moderate
opacification, this is not established with absolute certainty, but
I think is probably present. The lungs are clear except for mild
patchy density at the right base. No evidence of right heart strain.

No mediastinal or hilar mass or lymphadenopathy. No aortic
pathology. Scans in the upper abdomen are unremarkable.

Review of the MIP images confirms the above findings.
IMPRESSION: Moderate to high level of suspicion for pulmonary emboli in the
right lower lobe pulmonary arterial branches. Clot burden is not
large. Certainty is not 100% based on only moderate contrast
opacification. However, I think the diagnosis is likely.

Mild patchy atelectasis at the right base laterally.

## 2017-01-20 ENCOUNTER — Emergency Department (HOSPITAL_BASED_OUTPATIENT_CLINIC_OR_DEPARTMENT_OTHER)
Admission: EM | Admit: 2017-01-20 | Discharge: 2017-01-20 | Disposition: A | Discharge status: Home / Self Care | Payer: BC Managed Care – PPO | Attending: Emergency Medicine | Admitting: Emergency Medicine

## 2017-01-20 ENCOUNTER — Encounter (HOSPITAL_BASED_OUTPATIENT_CLINIC_OR_DEPARTMENT_OTHER): Payer: Self-pay | Admitting: Emergency Medicine

## 2017-01-20 DIAGNOSIS — J069 Acute upper respiratory infection, unspecified: Secondary | ICD-10-CM | POA: Insufficient documentation

## 2017-01-20 DIAGNOSIS — Z7901 Long term (current) use of anticoagulants: Secondary | ICD-10-CM | POA: Insufficient documentation

## 2017-01-20 DIAGNOSIS — Z79899 Other long term (current) drug therapy: Secondary | ICD-10-CM | POA: Insufficient documentation

## 2017-01-20 DIAGNOSIS — R05 Cough: Secondary | ICD-10-CM | POA: Diagnosis present

## 2017-01-20 LAB — RAPID STREP SCREEN (MED CTR MEBANE ONLY): STREPTOCOCCUS, GROUP A SCREEN (DIRECT): NEGATIVE

## 2017-01-20 MED ORDER — DEXAMETHASONE SODIUM PHOSPHATE 10 MG/ML IJ SOLN
10.0000 mg | Freq: Once | INTRAMUSCULAR | Status: AC
  Administered 2017-01-20: 10 mg via INTRAMUSCULAR
  Filled 2017-01-20: qty 1

## 2017-01-20 MED ORDER — BENZONATATE 100 MG PO CAPS: 100.0000 mg | ORAL_CAPSULE | Freq: Three times a day (TID) | ORAL | 0 refills | Status: DC

## 2017-01-20 MED ORDER — FLUTICASONE PROPIONATE 50 MCG/ACT NA SUSP: 2.0000 | Freq: Every day | NASAL | 12 refills | Status: DC

## 2017-01-20 MED ORDER — BENZONATATE 100 MG PO CAPS
100.0000 mg | ORAL_CAPSULE | Freq: Once | ORAL | Status: AC
  Administered 2017-01-20: 100 mg via ORAL
  Filled 2017-01-20: qty 1

## 2017-01-20 NOTE — Discharge Instructions (Signed)
Please take the Tessalon for cough. Take Flonase for nasal congestion. Motrin and Tylenol for fever and pain. Make sure you staying hydrated plenty of fluids. This is likely a viral illness and antibiotics are not indicated. Follow-up with her primary care doctor if her symptoms are not improving. Return to the ED if your symptoms worsen. Have discussed return precautions.

## 2017-01-20 NOTE — ED Provider Notes (Signed)
[facility] Provider Note   CSN: [id number] Arrival date & time: 01/20/17  1605  By signing my name below, I, Joe Deleon, attest that this documentation has been prepared under the direction and in the presence of Joe Kuba, PA-C. Electronically Signed: Teofilo Deleon, ED Scribe. 01/20/2017. 5:05 PM.    History   Chief Complaint No chief complaint on file.  The history is provided by the patient. No language interpreter was used.   HPI Comments:  Joe Deleon is a 31 y.o. male who presents to the Emergency Department complaining of a persistent, productive cough x 5 days. Pt complains of associated general fatigue and neck pain that resolved 3 days ago. Pt states that 2 days ago his chills and myalgias resolved after 3 days, but his cough, rhinorrhea and sore throat have persisted. Pt took theraflu with mild relief. Pt has been drinking fluids normally.Denies any sick contacts. Did not receive an influenza vaccination this year. Pt denies other associated symptoms including headache, vision changes, shortness of breath, nausea, vomiting, diarrhea.   Past Medical History:  Diagnosis Date  . Cardiomyopathy [facility])    released by cardiologist in 2007. Dr. Elizebeth Brooking  . Complication of anesthesia   . Family history of adverse reaction to anesthesia    MOTHER HAS NAUSEA   . Heart disease   . PE (pulmonary embolism) 10/2015  . PONV (postoperative nausea and vomiting)   . Umbilical hernia     Patient Active Problem List   Diagnosis Date Noted  . Encounter for therapeutic drug level monitoring 11/19/2015  . Pulmonary embolus (HCC) 11/15/2015  . Pulmonary embolism (HCC) 11/15/2015  . Morbidly obese (HCC) 11/15/2015  . Morbid obesity (HCC) 11/15/2015  . 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 Cardiomyopathy (HCC)   . Umbilical hernia without obstruction and without gangrene 05/19/2014    Past Surgical History:  Procedure Laterality Date  . KNEE SURGERY Left   . WISDOM  TOOTH EXTRACTION         Home Medications    Prior to Admission medications   Medication Sig Start Date End Date Taking? Authorizing Provider  oxyCODONE-acetaminophen (PERCOCET/ROXICET) 5-325 MG tablet Take 1 tablet by mouth every 6 (six) hours as needed for moderate pain.  05/28/14   Historical Provider, MD  warfarin (COUMADIN) 5 MG tablet Take 1 tablet (5 mg total) by mouth daily at 6 PM. 11/16/15   Jana Half, MD    Family History Family History  Problem Relation Age of Onset  . Hypertension Father   . Cancer Maternal Grandmother     bladder  . Diabetes Maternal Grandmother   . Hypertension Maternal Grandfather     Social History Social History  Substance Use Topics  . Smoking status: Never Smoker  . Smokeless tobacco: Never Used  . Alcohol use No     Allergies   Patient has no known allergies.   Review of Systems Review of Systems  Constitutional: Positive for fatigue. Negative for chills and fever.  HENT: Positive for rhinorrhea and sore throat.   Respiratory: Positive for cough.   Musculoskeletal: Positive for neck pain.  All other systems reviewed and are negative.    Physical Exam Updated Vital Signs BP 144/81   Pulse 97   Temp 99 F (37.2 C) (Oral)   Resp 20   Ht 6\' 1"  (1.854 m)   Wt (!) 154.2 kg   SpO2 98%   BMI 44.86 kg/m   Physical Exam  Constitutional: He is oriented  to person, place, and time. He appears well-developed and well-nourished. No distress.  Patient is nontoxic appearing. He is no acute distress.  HENT:  Head: Normocephalic and atraumatic.  Right Ear: Tympanic membrane, external ear and ear canal normal.  Left Ear: Tympanic membrane, external ear and ear canal normal.  Nose: Mucosal edema and rhinorrhea present.  Mouth/Throat: Uvula is midline and mucous membranes are normal. No trismus in the jaw. Posterior oropharyngeal edema and posterior oropharyngeal erythema present. No oropharyngeal exudate or tonsillar  abscesses. Tonsils are 1+ on the right. Tonsils are 1+ on the left. No tonsillar exudate.  Eyes: Conjunctivae are normal.  Neck: Normal range of motion. Neck supple.  Cardiovascular: Normal rate, regular rhythm, normal heart sounds and intact distal pulses.  Exam reveals no gallop and no friction rub.   No murmur heard.  Heart rate 92 on exam and regular.  Pulmonary/Chest: Effort normal and breath sounds normal. No respiratory distress. He has no wheezes. He has no rales. He exhibits no tenderness.  CTAB  Abdominal: Soft. There is no tenderness.  Musculoskeletal: Normal range of motion. He exhibits no edema.  Lymphadenopathy:    He has no cervical adenopathy.  Neurological: He is alert and oriented to person, place, and time.  Skin: Skin is warm and dry. Capillary refill takes less than 2 seconds.  Psychiatric: He has a normal mood and affect.  Nursing note and vitals reviewed.    ED Treatments / Results  DIAGNOSTIC STUDIES:  Oxygen Saturation is 97% on RA, normal by my interpretation.    COORDINATION OF CARE:  5:01 PM Will prescribe Flonase. Discussed treatment plan with pt at bedside and pt agreed to plan.   Labs (all labs ordered are listed, but only abnormal results are displayed) Labs Reviewed  RAPID STREP SCREEN (NOT AT [facility])  CULTURE, GROUP A STREP [facility])    EKG  EKG Interpretation None       Radiology No results found.  Procedures Procedures (including critical care time)  Medications Ordered in ED Medications  dexamethasone (DECADRON) injection 10 mg (not administered)  benzonatate (TESSALON) capsule 100 mg (100 mg Oral Given 01/20/17 1719)     Initial Impression / Assessment and Plan / ED Course  I have reviewed the triage vital signs and the nursing notes.  Pertinent labs & imaging results that were available during my care of the patient were reviewed by me and considered in my medical decision making (see chart for details).     Patients  symptoms are consistent with URI, likely viral etiology. Patient is without fever in the ED. Mild tachycardia noted in triage. On my exam patient heart rate was normal. Strep test was negative. Lungs are clear to auscultation bilaterally. No indication for chest x-ray. The patient without any exudates. No concern for PTA or deep neck infection. Discussed that antibiotics are not indicated for viral infections. Patient is nontoxic appearing. Vital signs are stable. He appears be in no acute distress. He is not hypoxic or tachypneic. Pt will be discharged with symptomatic treatment.  Verbalizes understanding and is agreeable with plan. Pt is hemodynamically stable & in NAD prior to dc.   Final Clinical Impressions(s) / ED Diagnoses   Final diagnoses:  URI with cough and congestion    New Prescriptions New Prescriptions   BENZONATATE (TESSALON) 100 MG CAPSULE    Take 1 capsule (100 mg total) by mouth every 8 (eight) hours.   FLUTICASONE (FLONASE) 50 MCG/ACT NASAL SPRAY    Place  2 sprays into both nostrils daily.  I personally performed the services described in this documentation, which was scribed in my presence. The recorded information has been reviewed and is accurate.     Rise MuKenneth T Walter Grima, PA-C 01/20/17 1722    Alvira MondayErin Schlossman, MD 01/21/17 1352

## 2017-01-22 LAB — CULTURE, GROUP A STREP (THRC)

## 2017-01-23 ENCOUNTER — Telehealth: Payer: Self-pay | Admitting: Emergency Medicine

## 2017-01-23 NOTE — Progress Notes (Signed)
ED Antimicrobial Stewardship Positive Culture Follow Up   Joe Deleon is an 31 y.o. male who [facility] with a chief complaint of No chief complaint on file.   Recent Results (from the past 720 hour(s))  Rapid strep screen     Status: None   Collection Time: 01/20/17  4:14 PM  Result Value Ref Range Status   Streptococcus, Group A Screen (Direct) NEGATIVE NEGATIVE Final    Comment: (NOTE) A Rapid Antigen test may result negative if the antigen level in the sample is below the detection level of this test. The FDA has not cleared this test as a stand-alone test therefore the rapid antigen negative result has reflexed to a Group A Strep culture.   Culture, group A strep     Status: None   Collection Time: 01/20/17  4:14 PM  Result Value Ref Range Status   Specimen Description THROAT  Final   Special Requests NONE Reflexed from [id number]  Final   Culture FEW GROUP A STREP (S.PYOGENES) ISOLATED  Final   Report Status 01/22/2017 FINAL  Final    [x]  Patient discharged originally without antimicrobial agent and treatment is now indicated  New antibiotic prescription: Amoxicillin 500mg  PO BID x 10 days  ED Provider: Geoffery Lyonsouglas Delo MD   Armandina StammerBATCHELDER,Elaria Osias J 01/23/2017, 9:09 AM Infectious Diseases Pharmacist Phone# [phone]

## 2017-01-23 NOTE — Telephone Encounter (Signed)
Post ED Visit - Positive Culture Follow-up: Successful Patient Follow-Up  Culture assessed and recommendations reviewed by: [x]  Enzo BiNathan Batchelder, Pharm.D. []  Celedonio MiyamotoJeremy Frens, Pharm.D., BCPS []  Garvin FilaMike Maccia, Pharm.D. []  Georgina PillionElizabeth Martin, Pharm.D., BCPS []  [place]oveMinh Pham, [address] BoulevardPharm.D., BCPS, AAHIVP []  Estella HuskMichelle Turner, Pharm.D., BCPS, AAHIVP []  Tennis Mustassie Stewart, Pharm.D. []  Sherle Poeob Vincent, [place]D.  Positive strep culture  [x]  Patient discharged without antimicrobial prescription and treatment is now indicated []  Organism is resistant to prescribed ED discharge antimicrobial []  Patient with positive blood cultures  Changes discussed with ED provider: Dr. Judd Lienelo New antibiotic prescription start Amoxicillin 500mg  po bid x 10 days [facility] [place]   Contacted patient, 01/23/17 1226   Berle MullMiller, Yarethzi Branan 01/23/2017, 12:25 PM

## 2017-09-05 ENCOUNTER — Ambulatory Visit (INDEPENDENT_AMBULATORY_CARE_PROVIDER_SITE_OTHER): Payer: BC Managed Care – PPO

## 2017-09-05 ENCOUNTER — Ambulatory Visit (INDEPENDENT_AMBULATORY_CARE_PROVIDER_SITE_OTHER): Payer: BC Managed Care – PPO | Admitting: Physician Assistant

## 2017-09-05 DIAGNOSIS — M25462 Effusion, left knee: Secondary | ICD-10-CM

## 2017-09-05 DIAGNOSIS — M25512 Pain in left shoulder: Secondary | ICD-10-CM

## 2017-09-05 DIAGNOSIS — M25562 Pain in left knee: Secondary | ICD-10-CM | POA: Diagnosis not present

## 2017-09-05 MED ORDER — LIDOCAINE HCL 1 % IJ SOLN
3.0000 mL | INTRAMUSCULAR | Status: AC | PRN
  Administered 2017-09-05: 3 mL

## 2017-09-05 MED ORDER — METHYLPREDNISOLONE ACETATE 40 MG/ML IJ SUSP
40.0000 mg | INTRAMUSCULAR | Status: AC | PRN
  Administered 2017-09-05: 40 mg via INTRA_ARTICULAR

## 2017-09-05 MED ORDER — LIDOCAINE HCL 1 % IJ SOLN
1.0000 mL | INTRAMUSCULAR | Status: AC | PRN
  Administered 2017-09-05: 1 mL
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
## 2017-09-06 ENCOUNTER — Telehealth (INDEPENDENT_AMBULATORY_CARE_PROVIDER_SITE_OTHER): Payer: Self-pay | Admitting: Physician Assistant

## 2017-09-06 NOTE — Telephone Encounter (Signed)
Patient called stating that he needs a return to work note with a restriction of under 50lbs.  He needs this note faxed to his employer by tomorrow.  Their fax #[phone].  Thank you.

## 2017-09-06 NOTE — Telephone Encounter (Signed)
Please advise 

## 2017-09-06 NOTE — Telephone Encounter (Signed)
Ok whatever he wants

## 2017-09-07 ENCOUNTER — Encounter (INDEPENDENT_AMBULATORY_CARE_PROVIDER_SITE_OTHER): Payer: Self-pay

## 2017-09-07 NOTE — Telephone Encounter (Signed)
Faxed to number provided

## 2017-09-10 ENCOUNTER — Telehealth (INDEPENDENT_AMBULATORY_CARE_PROVIDER_SITE_OTHER): Payer: Self-pay | Admitting: Orthopaedic Surgery

## 2017-09-10 NOTE — Telephone Encounter (Signed)
Patient called needing a copy of the office notes that was sent to his employer emailed to him at [email or web address] as soon as possible. He said it's very urgent that he gets this today. CB # [phone]

## 2017-09-10 NOTE — Telephone Encounter (Signed)
Pt needs 2nd written notice to be sent to his personal email * [email or web address]*  Phone number [phone]

## 2017-09-10 NOTE — Telephone Encounter (Signed)
Can you please email the note that's in the chart to his email

## 2017-09-19 ENCOUNTER — Ambulatory Visit (INDEPENDENT_AMBULATORY_CARE_PROVIDER_SITE_OTHER): Payer: BC Managed Care – PPO | Admitting: Orthopaedic Surgery

## 2017-09-25 ENCOUNTER — Ambulatory Visit (INDEPENDENT_AMBULATORY_CARE_PROVIDER_SITE_OTHER): Payer: BC Managed Care – PPO | Admitting: Orthopaedic Surgery

## 2017-09-25 DIAGNOSIS — M25562 Pain in left knee: Secondary | ICD-10-CM

## 2017-09-25 DIAGNOSIS — M25462 Effusion, left knee: Secondary | ICD-10-CM | POA: Diagnosis not present

## 2017-09-25 MED ORDER — LIDOCAINE HCL 1 % IJ SOLN
3.0000 mL | INTRAMUSCULAR | Status: AC | PRN
  Administered 2017-09-25: 3 mL

## 2017-09-25 MED ORDER — METHYLPREDNISOLONE ACETATE 40 MG/ML IJ SUSP
40.0000 mg | INTRAMUSCULAR | Status: AC | PRN
  Administered 2017-09-25: 40 mg via INTRA_ARTICULAR
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
## 2017-10-22 ENCOUNTER — Telehealth (INDEPENDENT_AMBULATORY_CARE_PROVIDER_SITE_OTHER): Payer: Self-pay | Admitting: Orthopaedic Surgery

## 2017-10-22 NOTE — Telephone Encounter (Signed)
Patient left message on my vm. I returned called, lmvm.

## 2017-10-23 NOTE — Telephone Encounter (Signed)
Patient called left msg vm stating his atty has requested his records and hasn't received anything. I called him back and got his vm. I left him msg advising [employer] processed and they have noted that records were mailed on 11/8. I suggested if his atty has questions that they call [employer] and left their ph#

## 2017-10-26 ENCOUNTER — Telehealth (INDEPENDENT_AMBULATORY_CARE_PROVIDER_SITE_OTHER): Payer: Self-pay | Admitting: Orthopaedic Surgery

## 2017-10-26 NOTE — Telephone Encounter (Signed)
Patient called asked for a call back concerning his October note stating he can return back to work. The number to contact patient is [phone]

## 2017-10-30 NOTE — Telephone Encounter (Signed)
Patient states he already got this not from medical records

## 2017-11-09 NOTE — Telephone Encounter (Signed)
This has been taken care of.

## 2018-12-19 ENCOUNTER — Telehealth: Payer: Self-pay | Admitting: Internal Medicine

## 2018-12-19 NOTE — Telephone Encounter (Signed)
New Message:    Pt says he needs a letter stating that he is physcially able to drive , he needs this for his job. Please fax to  [facility] -[phone]

## 2018-12-19 NOTE — Telephone Encounter (Signed)
Left the pt a message to call the office back and request to speak with a triage nurse, regarding letter needed.

## 2018-12-20 NOTE — Telephone Encounter (Signed)
Patient was last seen in cardiology in 2017.  Would need appointment in cardiology.

## 2018-12-25 NOTE — Telephone Encounter (Signed)
Left detailed message (DPR) on mobile number to call back and schedule appointment in cardiology if needed or to follow up so that a letter could be considered.  Pt has not been seen in cardiology in several years.

## 2018-12-27 ENCOUNTER — Telehealth: Payer: Self-pay | Admitting: Internal Medicine

## 2018-12-27 NOTE — Telephone Encounter (Signed)
Follow Up:     Returning your call from 12-25-18.

## 2018-12-27 NOTE — Telephone Encounter (Signed)
Left message asking patient to please call back and schedule an appointment with Dr. Tenny Crawoss or APP to be seen.  We are unable to write a letter for work due to not being seen sine Feb 2017.

## 2018-12-31 ENCOUNTER — Encounter: Payer: Self-pay | Admitting: Physician Assistant

## 2018-12-31 DIAGNOSIS — I119 Hypertensive heart disease without heart failure: Secondary | ICD-10-CM | POA: Insufficient documentation

## 2018-12-31 DIAGNOSIS — G4733 Obstructive sleep apnea (adult) (pediatric): Secondary | ICD-10-CM | POA: Insufficient documentation

## 2018-12-31 NOTE — Progress Notes (Signed)
Cardiology Office Note:    Date:  01/01/2019   ID:  Joe Deleon, DOB 1986/04/04, MRN [id number]  PCP:  Verlon Au, MD  Cardiologist:  Dietrich Pates, MD   Electrophysiologist:  None   Referring MD: Verlon Au, MD   Chief Complaint  Patient presents with  . Follow-up    left ventricular hypertrophy      History of Present Illness:    Joe Deleon is a 33 y.o. male with possible hypertrophic cardiomyopathy, prior pulmonary embolism after knee surgery in 2016, severe sleep apnea on CPAP, obesity.  He was previously followed by pediatric cardiology at [facility] for possible hypertrophic cardiomyopathy.  He established with Dr. Tenny Craw in 2016 and was last seen in clinic in February 2017.  Echocardiogram in December 2016 demonstrated severe LVH.   Joe Deleon returns for follow up.  He is here with his mother.  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  He has gained about 40 lbs since he saw Dr. Tenny Craw last.  He does not check his BP at home.  He stays active with his job.  He "runs the court" with his basketball team.  He has not had any chest pain, significant shortness of breath.  He has not had leg swelling, palpitations, paroxysmal nocturnal dyspnea, syncope.  He wears a CPAP nightly.    Prior CV studies:   The following studies were reviewed today:  Echocardiogram 11/03/2015 Severe LVH, EF 60-65, normal wall motion, grade 1 diastolic dysfunction, moderate TR, PASP 47  Past Medical History:  Diagnosis Date  . Cardiomyopathy [facility])    released by cardiologist in 2007. Dr. Elizebeth Brooking  . Complication of anesthesia   . Hypertensive heart disease    Echo 12/16:  Severe LVH, EF 60-65, normal wall motion, grade 1  diastolic dysfunction, moderate TR, PASP 47  . OSA on CPAP   . PE (pulmonary embolism) 10/2015   Tx with Lovenox x 3 mos, Coumadin 30 days; no a/c since  . Umbilical hernia    Surgical Hx: The patient  has a past surgical history that includes Wisdom tooth extraction and Knee surgery (Left, 2016).   Current Medications: No outpatient medications have been marked as taking for the 01/01/19 encounter (Office Visit) with Tereso Newcomer T, PA-C.     Allergies:   Patient has no known allergies.   Social History   Tobacco Use  . Smoking status: Never Smoker  . Smokeless tobacco: Never Used  Substance Use Topics  . Alcohol use: No    Alcohol/week: 0.0 standard drinks  . Drug use: No     Family Hx: The patient's family history includes Cancer in his father and maternal grandmother; Diabetes in his maternal grandmother; Heart failure in his father; Hypertension in his father, maternal grandfather, and mother. There is no history of Sudden Cardiac Death.  ROS:   Please see the history of present illness.    ROS All other systems reviewed and are negative.   EKGs/Labs/Other Test Reviewed:    EKG:  EKG is   ordered today.  The ekg ordered today demonstrates normal sinus rhythm, HR 77, normal axis, TW inversion 3, +/- aVF, PVC, QTc 400, similar to last  ECG in 09/2015.  Recent Labs: No results found for requested labs within last 8760 hours.   Recent Lipid Panel No results found for: CHOL, TRIG, HDL, CHOLHDL, LDLCALC, LDLDIRECT  Physical Exam:    VS:  BP 120/80 (BP Location: Right Arm)   Pulse 77   Ht 6\' 1"  (1.854 m)   Wt (!) 401 lb (181.9 kg)   SpO2 97%   BMI 52.91 kg/m     Wt Readings from Last 3 Encounters:  01/01/19 (!) 401 lb (181.9 kg)  01/20/17 (!) 340 lb (154.2 kg)  01/14/16 (!) 363 lb (164.7 kg)     Physical Exam  Constitutional: He is oriented to person, place, and time. He appears well-developed and well-nourished. No distress.  HENT:  Head: Normocephalic and  atraumatic.  Eyes: No scleral icterus.  Neck: Neck supple. No JVD present. No thyromegaly present.  Cardiovascular: Normal rate, regular rhythm, S1 normal and S2 normal.  No murmur heard. Pulmonary/Chest: Breath sounds normal. He has no rales.  Abdominal: Soft. There is no hepatomegaly.  Musculoskeletal:        General: No edema.  Lymphadenopathy:    He has no cervical adenopathy.  Neurological: He is alert and oriented to person, place, and time.  Skin: Skin is warm and dry.  Psychiatric: He has a normal mood and affect.    ASSESSMENT & PLAN:    LVH (left ventricular hypertrophy)  He was previously followed by pediatric cardiology at [facility] for possible HCM.  He recalls having an ETT and Holter monitor.  He does not recall having an MRI.  His echo in 2016 demonstrated severe LVH but no LVOT gradient or dynamic obstruction.  He has no FHx of sudden death or HOCM.  He has not had any hx of syncope.  His BP was significantly elevated today when he first arrived.  However, this was with a normal sized cuff over his forearm.  When I used a thigh cuff on his upper arm, his BP was normal.  He has no murmur on exam.  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  He needs a letter that states he is able to drive.  At this point, I do not know of a reason why he should not drive.  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  He has until March 2020 before he needs to take the class again. I would also like to keep an eye on his BP to make sure it is controlled.  I have asked him to get a BP cuff and track at home.  His arm may not accommodate a normal sized cuff.  He may need to use a wrist cuff.  I have asked him to get this checked to make sure it agrees with a large (thigh cuff) sized Hg cuff.    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  -FU with Dr. Tenny Craw in 3 mos.   OSA (obstructive sleep apnea)  Continue CPAP.  Morbid obesity (HCC)  We discussed the importance of weight loss to maintain normal BP.    Dispo:  Return in about 3 months (around  04/01/2019) for Routine Follow Up, w/ Dr. Tenny Craw.   Medication Adjustments/Labs and Tests Ordered: Current medicines are reviewed at length with the patient today.  Concerns regarding medicines are outlined above.  Tests Ordered: Orders Placed This Encounter  Procedures  . EKG 12-Lead  . ECHOCARDIOGRAM COMPLETE   Medication Changes: No orders of the defined types were placed in this encounter.   Signed, Tereso Newcomer, PA-C  01/01/2019 1:54 PM    [facility] [address] [place], [place], [place]  [address] Phone: [phone]; Fax: [phone]

## 2019-01-01 ENCOUNTER — Ambulatory Visit: Payer: BC Managed Care – PPO | Admitting: Physician Assistant

## 2019-01-01 ENCOUNTER — Encounter: Payer: Self-pay | Admitting: Physician Assistant

## 2019-01-01 VITALS — BP 120/80 | HR 77 | Ht 73.0 in | Wt >= 6400 oz

## 2019-01-01 DIAGNOSIS — G4733 Obstructive sleep apnea (adult) (pediatric): Secondary | ICD-10-CM

## 2019-01-01 DIAGNOSIS — I517 Cardiomegaly: Secondary | ICD-10-CM

## 2019-01-01 NOTE — Patient Instructions (Signed)
Medication Instructions:  Your physician recommends that you continue on your current medications as directed. Please refer to the Current Medication list given to you today.   If you need a refill on your cardiac medications before your next appointment, please call your pharmacy.   Lab work: NONE  If you have labs (blood work) drawn today and your tests are completely normal, you will receive your results only by: Marland Kitchen MyChart Message (if you have MyChart) OR . A paper copy in the mail If you have any lab test that is abnormal or we need to change your treatment, we will call you to review the results.  Testing/Procedures: Your physician has requested that you have an echocardiogram. Echocardiography is a painless test that uses sound waves to create images of your heart. It provides your doctor with information about the size and shape of your heart and how well your heart's chambers and valves are working. This procedure takes approximately one hour. There are no restrictions for this procedure.  Follow-Up: At [facility], you and your health needs are our priority.  As part of our continuing mission to provide you with exceptional heart care, we have created designated Provider Care Teams.  These Care Teams include your primary Cardiologist (physician) and Advanced Practice Providers (APPs -  Physician Assistants and Nurse Practitioners) who all work together to provide you with the care you need, when you need it. . You will need a follow up appointment in:  3 months.   You may see Dietrich Pates, MD or one of the following Advanced Practice Providers on your designated Care Team: . Tereso Newcomer, PA-C . Vin Bhagat, PA-C . Berton Bon, NP  Any Other Special Instructions Will Be Listed Below (If Applicable).

## 2019-01-21 ENCOUNTER — Other Ambulatory Visit (HOSPITAL_COMMUNITY): Payer: Self-pay

## 2019-01-27 ENCOUNTER — Encounter: Payer: Self-pay | Admitting: Physician Assistant

## 2019-01-27 ENCOUNTER — Encounter (INDEPENDENT_AMBULATORY_CARE_PROVIDER_SITE_OTHER): Payer: Self-pay

## 2019-01-27 ENCOUNTER — Ambulatory Visit (HOSPITAL_COMMUNITY): Payer: BC Managed Care – PPO | Attending: Internal Medicine

## 2019-01-27 DIAGNOSIS — I517 Cardiomegaly: Secondary | ICD-10-CM | POA: Diagnosis not present

## 2019-01-27 DIAGNOSIS — G4733 Obstructive sleep apnea (adult) (pediatric): Secondary | ICD-10-CM | POA: Diagnosis not present

## 2019-01-27 MED ORDER — PERFLUTREN LIPID MICROSPHERE
1.0000 mL | INTRAVENOUS | Status: AC | PRN
  Administered 2019-01-27: 2 mL via INTRAVENOUS

## 2019-01-28 ENCOUNTER — Telehealth: Payer: Self-pay | Admitting: *Deleted

## 2019-01-28 NOTE — Telephone Encounter (Signed)
LMOVM TO CALL BACK FOR RESULTS 

## 2019-01-28 NOTE — Telephone Encounter (Signed)
-----   Message from Beatrice Lecher, [place] sent at 01/27/2019  5:26 PM EST ----- Please call the patient. His echocardiogram shows normal heart function (ejection fraction) and moderately increased thickness of the left ventricle.  There is no evidence of obstruction.   Recommendations:  - Continue current medications and follow up as planned.   - Send copy to PCP.  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 Tereso Newcomer, PA-C    01/27/2019 5:13 PM

## 2019-03-25 ENCOUNTER — Telehealth: Payer: Self-pay | Admitting: Physician Assistant

## 2019-03-25 NOTE — Telephone Encounter (Signed)
° °  4/28 :   Left VM for patient to return my call regarding visit on 04/01/2019. Visit needs to be a virtual visit. I will also need consent for visit. Please transfer patient call to my cell [phone].

## 2019-03-26 ENCOUNTER — Telehealth: Payer: Self-pay | Admitting: *Deleted

## 2019-03-26 NOTE — Telephone Encounter (Signed)
L,VMOM TO CALL BACK ABOUT APPT ON 5-5

## 2019-04-01 ENCOUNTER — Ambulatory Visit: Payer: Self-pay | Admitting: Physician Assistant

## 2019-12-25 ENCOUNTER — Telehealth: Payer: Self-pay | Admitting: *Deleted

## 2019-12-25 NOTE — Telephone Encounter (Signed)
Patient overdue for follow up. I attempted to reach him to discuss and offer a virtual appointment for next week but did not get an answer. 

## 2022-03-10 ENCOUNTER — Telehealth: Payer: Self-pay | Admitting: Internal Medicine

## 2022-03-10 NOTE — Telephone Encounter (Signed)
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 He states that he is not sure why they are needing a letter from cardiology since he does not see [place] anymore. I advised the patient that he would need to schedule an appointment and be seen in order for [place] to write a letter clearing  him since it have been over 3 years since he has been seen and he will be a new patient with [place]. Patient verbalized understanding and hung up the phone before I could offer him an appointment.  ?

## 2022-03-10 NOTE — Telephone Encounter (Signed)
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 Pt would like nurse to call. Please advise ?
